# Patient Record
Sex: Male | Born: 1970 | Race: White | Hispanic: No | Smoking: Never smoker
Health system: Southern US, Community
[De-identification: ages and names within clinical notes are randomized; demographics above are authoritative.]

## PROBLEM LIST (undated history)

## (undated) DIAGNOSIS — E785 Hyperlipidemia, unspecified: Secondary | ICD-10-CM

## (undated) DIAGNOSIS — F329 Major depressive disorder, single episode, unspecified: Secondary | ICD-10-CM

## (undated) DIAGNOSIS — F32A Depression, unspecified: Secondary | ICD-10-CM

## (undated) DIAGNOSIS — K589 Irritable bowel syndrome without diarrhea: Secondary | ICD-10-CM

## (undated) DIAGNOSIS — K219 Gastro-esophageal reflux disease without esophagitis: Secondary | ICD-10-CM

## (undated) DIAGNOSIS — G629 Polyneuropathy, unspecified: Secondary | ICD-10-CM

## (undated) HISTORY — PX: KNEE ARTHROSCOPY: SUR90

## (undated) HISTORY — PX: EYE SURGERY: SHX253

## (undated) HISTORY — PX: GASTRIC BYPASS: SHX52

---

## 2005-02-04 HISTORY — PX: LIPOSUCTION TRUNK: SUR833

## 2015-05-24 ENCOUNTER — Encounter (INDEPENDENT_AMBULATORY_CARE_PROVIDER_SITE_OTHER): Payer: Self-pay | Admitting: Family Medicine

## 2015-05-24 DIAGNOSIS — Z0289 Encounter for other administrative examinations: Secondary | ICD-10-CM

## 2015-09-11 ENCOUNTER — Encounter: Payer: Self-pay | Admitting: *Deleted

## 2015-09-12 ENCOUNTER — Encounter
Admission: RE | Disposition: A | Discharge status: Home / Self Care | Payer: Self-pay | Source: Ambulatory Visit | Attending: Gastroenterology

## 2015-09-12 ENCOUNTER — Ambulatory Visit
Admission: RE | Admit: 2015-09-12 | Discharge: 2015-09-12 | Disposition: A | Discharge status: Home / Self Care | Payer: BC Managed Care – PPO | Source: Ambulatory Visit | Attending: Gastroenterology | Admitting: Gastroenterology

## 2015-09-12 ENCOUNTER — Ambulatory Visit: Payer: BC Managed Care – PPO | Admitting: Anesthesiology

## 2015-09-12 ENCOUNTER — Encounter: Payer: Self-pay | Admitting: *Deleted

## 2015-09-12 DIAGNOSIS — E785 Hyperlipidemia, unspecified: Secondary | ICD-10-CM | POA: Insufficient documentation

## 2015-09-12 DIAGNOSIS — D122 Benign neoplasm of ascending colon: Secondary | ICD-10-CM | POA: Insufficient documentation

## 2015-09-12 DIAGNOSIS — K589 Irritable bowel syndrome without diarrhea: Secondary | ICD-10-CM | POA: Diagnosis not present

## 2015-09-12 DIAGNOSIS — Z7982 Long term (current) use of aspirin: Secondary | ICD-10-CM | POA: Diagnosis not present

## 2015-09-12 DIAGNOSIS — Z79899 Other long term (current) drug therapy: Secondary | ICD-10-CM | POA: Diagnosis not present

## 2015-09-12 DIAGNOSIS — K219 Gastro-esophageal reflux disease without esophagitis: Secondary | ICD-10-CM | POA: Diagnosis not present

## 2015-09-12 DIAGNOSIS — F329 Major depressive disorder, single episode, unspecified: Secondary | ICD-10-CM | POA: Diagnosis not present

## 2015-09-12 DIAGNOSIS — Z86010 Personal history of colonic polyps: Secondary | ICD-10-CM | POA: Insufficient documentation

## 2015-09-12 DIAGNOSIS — K5730 Diverticulosis of large intestine without perforation or abscess without bleeding: Secondary | ICD-10-CM | POA: Insufficient documentation

## 2015-09-12 DIAGNOSIS — D125 Benign neoplasm of sigmoid colon: Secondary | ICD-10-CM | POA: Insufficient documentation

## 2015-09-12 DIAGNOSIS — K640 First degree hemorrhoids: Secondary | ICD-10-CM | POA: Insufficient documentation

## 2015-09-12 HISTORY — DX: Irritable bowel syndrome, unspecified: K58.9

## 2015-09-12 HISTORY — DX: Depression, unspecified: F32.A

## 2015-09-12 HISTORY — DX: Polyneuropathy, unspecified: G62.9

## 2015-09-12 HISTORY — PX: COLONOSCOPY WITH PROPOFOL: SHX5780

## 2015-09-12 HISTORY — DX: Hyperlipidemia, unspecified: E78.5

## 2015-09-12 HISTORY — DX: Gastro-esophageal reflux disease without esophagitis: K21.9

## 2015-09-12 HISTORY — DX: Major depressive disorder, single episode, unspecified: F32.9

## 2015-09-12 SURGERY — COLONOSCOPY WITH PROPOFOL
Anesthesia: General

## 2015-09-12 MED ORDER — PHENYLEPHRINE HCL 10 MG/ML IJ SOLN
INTRAMUSCULAR | Status: DC | PRN
  Administered 2015-09-12 (×3): 100 ug via INTRAVENOUS

## 2015-09-12 MED ORDER — EPHEDRINE SULFATE 50 MG/ML IJ SOLN
INTRAMUSCULAR | Status: DC | PRN
  Administered 2015-09-12: 10 mg via INTRAVENOUS
  Administered 2015-09-12: 5 mg via INTRAVENOUS

## 2015-09-12 MED ORDER — FENTANYL CITRATE (PF) 100 MCG/2ML IJ SOLN
INTRAMUSCULAR | Status: DC | PRN
  Administered 2015-09-12: 50 ug via INTRAVENOUS

## 2015-09-12 MED ORDER — SODIUM CHLORIDE 0.9 % IV SOLN: INTRAVENOUS | Status: DC

## 2015-09-12 MED ORDER — MIDAZOLAM HCL 2 MG/2ML IJ SOLN
INTRAMUSCULAR | Status: DC | PRN
  Administered 2015-09-12: 1 mg via INTRAVENOUS

## 2015-09-12 MED ORDER — LIDOCAINE HCL (CARDIAC) 20 MG/ML IV SOLN
INTRAVENOUS | Status: DC | PRN
  Administered 2015-09-12: 60 mg via INTRAVENOUS

## 2015-09-12 MED ORDER — PROPOFOL 10 MG/ML IV BOLUS
INTRAVENOUS | Status: DC | PRN
  Administered 2015-09-12: 50 mg via INTRAVENOUS

## 2015-09-12 MED ORDER — SODIUM CHLORIDE 0.9 % IV SOLN
INTRAVENOUS | Status: DC
  Administered 2015-09-12: 15:00:00 via INTRAVENOUS

## 2015-09-12 MED ORDER — PROPOFOL 500 MG/50ML IV EMUL
INTRAVENOUS | Status: DC | PRN
  Administered 2015-09-12: 140 ug/kg/min via INTRAVENOUS

## 2015-09-12 NOTE — Transfer of Care (Signed)
Immediate Anesthesia Transfer of Care Note  Patient: Andrew Short  Procedure(s) Performed: Procedure(s): COLONOSCOPY WITH PROPOFOL (N/A)  Patient Location: Endoscopy Unit  Anesthesia Type:General  Level of Consciousness: awake, alert , oriented and patient cooperative  Airway & Oxygen Therapy: Patient Spontanous Breathing and Patient connected to nasal cannula oxygen  Post-op Assessment: Report given to RN, Post -op Vital signs reviewed and stable and Patient moving all extremities X 4  Post vital signs: Reviewed and stable  Last Vitals:  Vitals:   09/12/15 1429  BP: 134/84  Pulse: 62  Resp: 17  Temp: 36.7 C    Last Pain:  Vitals:   09/12/15 1429  TempSrc: Tympanic         Complications: No apparent anesthesia complications

## 2015-09-12 NOTE — Anesthesia Postprocedure Evaluation (Signed)
Anesthesia Post Note  Patient: Andrew Short  Procedure(s) Performed: Procedure(s) (LRB): COLONOSCOPY WITH PROPOFOL (N/A)  Patient location during evaluation: Endoscopy Anesthesia Type: General Level of consciousness: awake and alert Pain management: pain level controlled Vital Signs Assessment: post-procedure vital signs reviewed and stable Respiratory status: spontaneous breathing, nonlabored ventilation, respiratory function stable and patient connected to nasal cannula oxygen Cardiovascular status: blood pressure returned to baseline and stable Postop Assessment: no signs of nausea or vomiting Anesthetic complications: no    Last Vitals:  Vitals:   09/12/15 1429 09/12/15 1542  BP: 134/84   Pulse: 62   Resp: 17   Temp: 36.7 C 36.3 C    Last Pain:  Vitals:   09/12/15 1542  TempSrc: Tympanic                 Martha Clan

## 2015-09-12 NOTE — Anesthesia Preprocedure Evaluation (Signed)
Anesthesia Evaluation  Patient identified by MRN, date of birth, ID band Patient awake    Reviewed: Allergy & Precautions, NPO status , Patient's Chart, lab work & pertinent test results  Airway Mallampati: II       Dental no notable dental hx.  Dental staple in back:   Pulmonary neg pulmonary ROS,    Pulmonary exam normal        Cardiovascular negative cardio ROS Normal cardiovascular exam     Neuro/Psych PSYCHIATRIC DISORDERS Depression    GI/Hepatic Neg liver ROS, GERD  Medicated,IBS   Endo/Other  negative endocrine ROS  Renal/GU negative Renal ROS  negative genitourinary   Musculoskeletal negative musculoskeletal ROS (+)   Abdominal Normal abdominal exam  (+)   Peds negative pediatric ROS (+)  Hematology negative hematology ROS (+)   Anesthesia Other Findings   Reproductive/Obstetrics                             Anesthesia Physical Anesthesia Plan  ASA: II  Anesthesia Plan: General   Post-op Pain Management:    Induction: Intravenous  Airway Management Planned: Nasal Cannula  Additional Equipment:   Intra-op Plan:   Post-operative Plan:   Informed Consent: I have reviewed the patients History and Physical, chart, labs and discussed the procedure including the risks, benefits and alternatives for the proposed anesthesia with the patient or authorized representative who has indicated his/her understanding and acceptance.   Dental advisory given  Plan Discussed with: CRNA and Surgeon  Anesthesia Plan Comments:         Anesthesia Quick Evaluation

## 2015-09-12 NOTE — Op Note (Signed)
[facility] Gastroenterology Patient Name: Andrew Short Procedure Date: 09/12/2015 3:07 PM MRN: [id number] Account #: [id number] Date of Birth: 06-03-1970 Admit Type: Outpatient Age: 45 Room: [facility] ENDO ROOM 3 Gender: Male Note Status: Finalized Procedure:            Colonoscopy Indications:          Personal history of colonic polyps Providers:            Lollie Sails, MD Referring MD:         Leonie Douglas. Doy Hutching, MD (Referring MD) Medicines:            Monitored Anesthesia Care Complications:        No immediate complications. Procedure:            Pre-Anesthesia Assessment:                       - ASA Grade Assessment: II - A patient with mild                        systemic disease.                       After obtaining informed consent, the colonoscope was                        passed under direct vision. Throughout the procedure,                        the patient's blood pressure, pulse, and oxygen                        saturations were monitored continuously. The                        Colonoscope was introduced through the anus and                        advanced to the the cecum, identified by appendiceal                        orifice and ileocecal valve. The colonoscopy was                        performed with moderate difficulty due to poor bowel                        prep. Successful completion of the procedure was aided                        by lavage. The patient tolerated the procedure well.                        The quality of the bowel preparation was fair. Findings:      A 2 mm polyp was found in the proximal ascending colon. The polyp was       flat. The polyp was removed with a cold biopsy forceps. Resection and       retrieval were complete.      A 1 mm polyp was found in the distal sigmoid colon. The polyp was       sessile.  The polyp was removed with a cold biopsy forceps. Resection and       retrieval were complete.      The  retroflexed view of the distal rectum and anal verge was normal and       showed no anal or rectal abnormalities.      Non-bleeding internal hemorrhoids were found during anoscopy. The       hemorrhoids were small and Grade I (internal hemorrhoids that do not       prolapse).      A few small to medium-mouthed diverticula were found in the sigmoid       colon, descending colon, transverse colon and ascending colon.      The digital rectal exam was normal. Impression:           - Preparation of the colon was fair.                       - One 2 mm polyp in the proximal ascending colon,                        removed with a cold biopsy forceps. Resected and                        retrieved.                       - One 1 mm polyp in the distal sigmoid colon, removed                        with a cold biopsy forceps. Resected and retrieved.                       - The distal rectum and anal verge are normal on                        retroflexion view.                       - Non-bleeding internal hemorrhoids.                       - Diverticulosis in the sigmoid colon and in the                        descending colon. Recommendation:       - Discharge patient to home.                       - Telephone GI clinic for pathology results in 1 week. Procedure Code(s):    --- Professional ---                       [phone], Colonoscopy, flexible; with biopsy, single or                        multiple Diagnosis Code(s):    --- Professional ---                       K64.0, First degree hemorrhoids                       D12.2, Benign  neoplasm of ascending colon                       D12.5, Benign neoplasm of sigmoid colon                       Z86.010, Personal history of colonic polyps                       K57.30, Diverticulosis of large intestine without                        perforation or abscess without bleeding CPT copyright 2016 American Medical Association. All rights reserved. The codes  documented in this report are preliminary and upon coder review may  be revised to meet current compliance requirements. Lollie Sails, MD 09/12/2015 3:40:58 PM This report has been signed electronically. Number of Addenda: 0 Note Initiated On: 09/12/2015 3:07 PM Scope Withdrawal Time: 0 hours 8 minutes 41 seconds  Total Procedure Duration: 0 hours 22 minutes 40 seconds       [facility]

## 2015-09-12 NOTE — H&P (Signed)
Outpatient short stay form Pre-procedure 09/12/2015 2:57 PM Lollie Sails MD  Primary Physician: Dr. Fulton Reek  Reason for visit:  Colonoscopy  History of present illness:  Patient is a 45 year old male presenting today as above. He has personal history of  colon polyps in the past. He tolerated his prep well. He takes no aspirin with the exception of 81 mg aspirin daily this is been held for a couple days. or blood thinning agents.    Current Facility-Administered Medications:  .  0.9 %  sodium chloride infusion, , Intravenous, Continuous, Lollie Sails, MD, Last Rate: 20 mL/hr at 09/12/15 1443 .  0.9 %  sodium chloride infusion, , Intravenous, Continuous, Lollie Sails, MD  Prescriptions Prior to Admission  Medication Sig Dispense Refill Last Dose  . aspirin 81 MG tablet Take 81 mg by mouth daily.   Past Week at Unknown time  . buPROPion (WELLBUTRIN XL) 300 MG 24 hr tablet Take 300 mg by mouth daily.   09/11/2015 at Unknown time  . citalopram (CELEXA) 20 MG tablet Take 20 mg by mouth daily.   09/11/2015 at Unknown time  . magnesium oxide (MAG-OX) 400 MG tablet Take 400 mg by mouth daily.   Past Month at Unknown time  . Multiple Vitamin (MULTIVITAMIN) tablet Take 1 tablet by mouth daily.   Past Month at Unknown time  . rOPINIRole (REQUIP) 2 MG tablet Take 2 mg by mouth at bedtime.   Past Week at Unknown time  . traZODone (DESYREL) 50 MG tablet Take 50 mg by mouth at bedtime.   Past Week at Unknown time     No Known Allergies   Past Medical History:  Diagnosis Date  . Depression   . GERD (gastroesophageal reflux disease)   . Hyperlipemia   . IBS (irritable bowel syndrome)   . Neuropathy (HCC)    BIL.    Review of systems:      Physical Exam    Heart and lungs: Regular rate and rhythm without rub or gallop, lungs are bilaterally clear.    HEENT: Normocephalic atraumatic eyes are anicteric    Other:     Pertinant exam for procedure: Soft nontender  nondistended bowel sounds positive normoactive.    Planned proceedures: Colonoscopy and indicated procedures. I have discussed the risks benefits and complications of procedures to include not limited to bleeding, infection, perforation and the risk of sedation and the patient wishes to proceed.    Lollie Sails, MD Gastroenterology 09/12/2015  2:57 PM

## 2015-09-13 ENCOUNTER — Encounter: Payer: Self-pay | Admitting: Gastroenterology

## 2015-09-13 ENCOUNTER — Other Ambulatory Visit: Payer: Self-pay | Admitting: Cardiology

## 2015-09-13 DIAGNOSIS — R079 Chest pain, unspecified: Secondary | ICD-10-CM

## 2015-09-14 LAB — SURGICAL PATHOLOGY

## 2015-09-18 ENCOUNTER — Ambulatory Visit (HOSPITAL_COMMUNITY): Payer: BC Managed Care – PPO

## 2015-09-21 ENCOUNTER — Encounter (HOSPITAL_COMMUNITY): Payer: Self-pay

## 2015-09-21 ENCOUNTER — Ambulatory Visit (HOSPITAL_COMMUNITY)
Admission: RE | Admit: 2015-09-21 | Discharge: 2015-09-21 | Disposition: A | Discharge status: Home / Self Care | Payer: BC Managed Care – PPO | Source: Ambulatory Visit | Attending: Cardiology | Admitting: Cardiology

## 2015-09-21 DIAGNOSIS — I2510 Atherosclerotic heart disease of native coronary artery without angina pectoris: Secondary | ICD-10-CM | POA: Insufficient documentation

## 2015-09-21 DIAGNOSIS — R079 Chest pain, unspecified: Secondary | ICD-10-CM

## 2015-09-21 MED ORDER — METOPROLOL TARTRATE 5 MG/5ML IV SOLN
INTRAVENOUS | Status: AC
  Filled 2015-09-21: qty 15

## 2015-09-21 MED ORDER — NITROGLYCERIN 0.4 MG SL SUBL
SUBLINGUAL_TABLET | SUBLINGUAL | Status: AC
  Filled 2015-09-21: qty 2

## 2015-09-21 MED ORDER — NITROGLYCERIN 0.4 MG SL SUBL
0.8000 mg | SUBLINGUAL_TABLET | Freq: Once | SUBLINGUAL | Status: AC
  Administered 2015-09-21: 0.8 mg via SUBLINGUAL
  Filled 2015-09-21: qty 25

## 2015-09-21 MED ORDER — METOPROLOL TARTRATE 5 MG/5ML IV SOLN
5.0000 mg | INTRAVENOUS | Status: DC | PRN
  Administered 2015-09-21 (×2): 5 mg via INTRAVENOUS
  Filled 2015-09-21 (×3): qty 5

## 2015-09-21 MED ORDER — IOPAMIDOL (ISOVUE-370) INJECTION 76%
INTRAVENOUS | Status: AC
  Administered 2015-09-21: 80 mL
  Filled 2015-09-21: qty 100

## 2015-09-21 NOTE — Progress Notes (Signed)
CT scan completed. Tolerated well. D/C home alone walking. Awake and alert. In no distress.

## 2017-07-16 ENCOUNTER — Encounter: Payer: Self-pay | Admitting: Family Medicine

## 2017-07-16 ENCOUNTER — Ambulatory Visit: Payer: Self-pay | Admitting: Family Medicine

## 2017-07-16 DIAGNOSIS — F32A Depression, unspecified: Secondary | ICD-10-CM | POA: Insufficient documentation

## 2017-07-16 DIAGNOSIS — F329 Major depressive disorder, single episode, unspecified: Secondary | ICD-10-CM

## 2017-07-16 NOTE — Progress Notes (Signed)
BP 113/77 (BP Location: Left Arm, Patient Position: Sitting, Cuff Size: Normal)   Pulse 75   Ht 5' 11.5" (1.816 m)   Wt 252 lb 12.8 oz (114.7 kg)   BMI 34.77 kg/m    Subjective:    Patient ID: Andrew Short, male    DOB: Feb 10, 1970, 46 y.o.   MRN: [id number]  HPI: Andrew Short is a 47 y.o. 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 Reviewed FAA policy and antidepressants.  Relevant past medical, surgical, family and social history reviewed and updated as indicated. Interim medical history since our last visit reviewed. Allergies and medications reviewed and updated.  Review of Systems  Constitutional: Negative.   HENT: Negative.   Eyes: Negative.   Respiratory: Negative.   Cardiovascular: Negative.   Gastrointestinal: Negative.   Endocrine: Negative.   Genitourinary: Negative.   Musculoskeletal: Negative.   Skin: Negative.   Allergic/Immunologic: Negative.   Neurological: Negative.   Hematological: Negative.   Psychiatric/Behavioral: Negative.     Per HPI unless specifically indicated above     Objective:    BP 113/77 (BP Location: Left Arm, Patient Position: Sitting, Cuff Size: Normal)   Pulse 75   Ht 5' 11.5" (1.816 m)   Wt 252 lb 12.8 oz (114.7 kg)   BMI 34.77 kg/m   Wt Readings from Last 3 Encounters:  07/16/17 252 lb 12.8 oz (114.7 kg)  09/12/15 223 lb (101.2 kg)  05/24/15 209 lb (94.8 kg)    Physical Exam  Constitutional: He is oriented to person, place, and time. He appears well-developed and well-nourished.  HENT:  Head: Normocephalic.  Right Ear: External ear normal.  Left Ear: External ear normal.  Nose: Nose normal.  Eyes: Pupils are equal, round, and reactive to light. Conjunctivae and EOM are normal.  Neck: Normal range of motion. Neck supple. No thyromegaly present.  Cardiovascular: Normal rate, regular rhythm, normal heart sounds and intact distal pulses.    Pulmonary/Chest: Effort normal and breath sounds normal.  Abdominal: Soft. Bowel sounds are normal. There is no splenomegaly or hepatomegaly.  Genitourinary: Penis normal.  Musculoskeletal: Normal range of motion.  Lymphadenopathy:    He has no cervical adenopathy.  Neurological: He is alert and oriented to person, place, and time. He has normal reflexes.  Skin: Skin is warm and dry.  Psychiatric: He has a normal mood and affect. His behavior is normal. Judgment and thought content normal.    Results for orders placed or performed during the hospital encounter of 09/12/15  Surgical pathology  Result Value Ref Range   SURGICAL PATHOLOGY      Surgical Pathology CASE: [id number] PATIENT: Salem Southers Surgical Pathology Report     SPECIMEN SUBMITTED: A. Colon polyp, ascending; cbx B. Colon polyp, distal sigmoid; cbx  CLINICAL HISTORY: None provided  PRE-OPERATIVE DIAGNOSIS: Personal HX of colon polyps  POST-OPERATIVE DIAGNOSIS: Polyps, hemorrhoids, diverticulosis     DIAGNOSIS: A. COLON POLYP, ASCENDING; COLD BIOPSY: - TUBULAR ADENOMA, 2 FRAGMENTS. - NEGATIVE FOR HIGH-GRADE DYSPLASIA AND MALIGNANCY.  B. COLON POLYP, DISTAL SIGMOID; COLD BIOPSY: - HYPERPLASTIC POLYP. - NEGATIVE FOR DYSPLASIA AND MALIGNANCY.   GROSS DESCRIPTION: A. Labeled: ascending polyp C BX Tissue fragment(s): 2 Size: less than 0.1 and 0.15 cm Description: Tan  Entirely submitted in one cassette(s).   B. Labeled: distal sigmoid polyp C BX Tissue fragment(s): 1 Size: 0.2 cm Description: tan  Entirely submitted in 1 cassette(s).  Final  Diagnosis performed by Bryan Lemma, MD.  Electronically signed 09/14/2015  7:06:10PM    The electronic signature indicates that the named Attending Pathologist has evaluated the specimen  Technical component performed at [facility], [address] [place], [place] [address] Lab: [phone] Dir: Darrick Penna. Evette Doffing, MD  Professional component  performed at [facility], [facility], [place], [place],  [address] Lab: [phone] Dir: Dellia Nims. Reuel Derby, MD        Assessment & Plan:   Problem List Items Addressed This Visit      Other   Depression    Discussed depression and FAA.  Reviewed certification path on medicine and off medication.  Gave patient printouts from the [employer]. 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          Follow up plan: Return if symptoms worsen or fail to improve.

## 2017-07-16 NOTE — Assessment & Plan Note (Signed)
Discussed depression and FAA.  Reviewed certification path on medicine and off medication.  Gave patient printouts from the [employer]. 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

## 2017-08-29 ENCOUNTER — Other Ambulatory Visit: Payer: Self-pay | Admitting: Internal Medicine

## 2017-08-29 DIAGNOSIS — M5442 Lumbago with sciatica, left side: Secondary | ICD-10-CM

## 2017-09-15 ENCOUNTER — Ambulatory Visit (INDEPENDENT_AMBULATORY_CARE_PROVIDER_SITE_OTHER): Payer: Self-pay | Admitting: Family Medicine

## 2017-09-15 ENCOUNTER — Encounter: Payer: Self-pay | Admitting: Family Medicine

## 2017-09-15 VITALS — BP 127/82 | HR 84 | Ht 71.5 in | Wt 271.0 lb

## 2017-09-15 DIAGNOSIS — Z029 Encounter for administrative examinations, unspecified: Secondary | ICD-10-CM

## 2017-09-15 DIAGNOSIS — Z0289 Encounter for other administrative examinations: Secondary | ICD-10-CM

## 2017-09-15 NOTE — Progress Notes (Signed)
   BP 127/82   Pulse 84   Ht 5' 11.5" (1.816 m)   Wt 271 lb (122.9 kg)   BMI 37.27 kg/m    Subjective:    Patient ID: Paxten Appelt, male    DOB: 28-Apr-1970, 47 y.o.   MRN: [id number]  HPI: Nochum Fenter is a 47 y.o. male  Chief Complaint  Patient presents with  . FAA    Class 3    refluux meds prn otherwise neg No sleep apnea sx or signs Relevant past medical, surgical, family and social history reviewed and updated as indicated. Interim medical history since our last visit reviewed. Allergies and medications reviewed and updated.  Review of Systems  Constitutional: Negative.   HENT: Negative.   Eyes: Negative.   Respiratory: Negative.   Cardiovascular: Negative.   Gastrointestinal: Negative.   Endocrine: Negative.   Genitourinary: Negative.   Musculoskeletal: Negative.   Skin: Negative.   Allergic/Immunologic: Negative.   Neurological: Negative.   Hematological: Negative.   Psychiatric/Behavioral: Negative.     Per HPI unless specifically indicated above     Objective:    BP 127/82   Pulse 84   Ht 5' 11.5" (1.816 m)   Wt 271 lb (122.9 kg)   BMI 37.27 kg/m   Wt Readings from Last 3 Encounters:  09/15/17 271 lb (122.9 kg)  07/16/17 252 lb 12.8 oz (114.7 kg)  09/12/15 223 lb (101.2 kg)    Physical Exam  Constitutional: He is oriented to person, place, and time. He appears well-developed and well-nourished.  HENT:  Head: Normocephalic.  Right Ear: External ear normal.  Left Ear: External ear normal.  Nose: Nose normal.  Eyes: Pupils are equal, round, and reactive to light. Conjunctivae and EOM are normal.  Neck: Normal range of motion. Neck supple. No thyromegaly present.  Cardiovascular: Normal rate, regular rhythm, normal heart sounds and intact distal pulses.  Pulmonary/Chest: Effort normal and breath sounds normal.  Abdominal: Soft. Bowel sounds are normal. There is no splenomegaly or hepatomegaly.  Genitourinary: Penis normal.  Musculoskeletal:  Normal range of motion.  Lymphadenopathy:    He has no cervical adenopathy.  Neurological: He is alert and oriented to person, place, and time. He has normal reflexes.  Skin: Skin is warm and dry.  Psychiatric: He has a normal mood and affect. His behavior is normal. Judgment and thought content normal.        Assessment & Plan:   Problem List Items Addressed This Visit    None    Visit Diagnoses    Encounter for Systems analyst [facility]) examination    -  Primary       Follow up plan: Return if symptoms worsen or fail to improve.

## 2017-09-16 ENCOUNTER — Ambulatory Visit: Payer: BC Managed Care – PPO

## 2017-09-18 ENCOUNTER — Ambulatory Visit
Admission: RE | Admit: 2017-09-18 | Discharge: 2017-09-18 | Disposition: A | Discharge status: Home / Self Care | Payer: BC Managed Care – PPO | Source: Ambulatory Visit | Attending: Internal Medicine | Admitting: Internal Medicine

## 2017-09-18 DIAGNOSIS — M5117 Intervertebral disc disorders with radiculopathy, lumbosacral region: Secondary | ICD-10-CM | POA: Diagnosis not present

## 2017-09-18 DIAGNOSIS — M5442 Lumbago with sciatica, left side: Secondary | ICD-10-CM

## 2017-09-18 DIAGNOSIS — M48061 Spinal stenosis, lumbar region without neurogenic claudication: Secondary | ICD-10-CM | POA: Diagnosis not present

## 2018-12-29 ENCOUNTER — Other Ambulatory Visit: Payer: Self-pay | Admitting: *Deleted

## 2018-12-29 DIAGNOSIS — Z20822 Contact with and (suspected) exposure to covid-19: Secondary | ICD-10-CM

## 2018-12-30 LAB — NOVEL CORONAVIRUS, NAA: SARS-CoV-2, NAA: NOT DETECTED

## 2019-02-16 ENCOUNTER — Other Ambulatory Visit: Payer: Self-pay

## 2019-02-16 ENCOUNTER — Emergency Department
Admission: EM | Admit: 2019-02-16 | Discharge: 2019-02-16 | Disposition: A | Discharge status: Home / Self Care | Payer: BC Managed Care – PPO | Attending: Emergency Medicine | Admitting: Emergency Medicine

## 2019-02-16 ENCOUNTER — Emergency Department: Payer: BC Managed Care – PPO

## 2019-02-16 ENCOUNTER — Encounter: Payer: Self-pay | Admitting: Emergency Medicine

## 2019-02-16 DIAGNOSIS — R1110 Vomiting, unspecified: Secondary | ICD-10-CM | POA: Insufficient documentation

## 2019-02-16 DIAGNOSIS — R1010 Upper abdominal pain, unspecified: Secondary | ICD-10-CM | POA: Diagnosis present

## 2019-02-16 DIAGNOSIS — R109 Unspecified abdominal pain: Secondary | ICD-10-CM

## 2019-02-16 DIAGNOSIS — R1012 Left upper quadrant pain: Secondary | ICD-10-CM | POA: Insufficient documentation

## 2019-02-16 LAB — URINALYSIS, COMPLETE (UACMP) WITH MICROSCOPIC
Bacteria, UA: NONE SEEN
Bilirubin Urine: NEGATIVE
Glucose, UA: NEGATIVE mg/dL
Hgb urine dipstick: NEGATIVE
Ketones, ur: NEGATIVE mg/dL
Leukocytes,Ua: NEGATIVE
Nitrite: NEGATIVE
Protein, ur: NEGATIVE mg/dL
Specific Gravity, Urine: 1.021 (ref 1.005–1.030)
Squamous Epithelial / LPF: NONE SEEN (ref 0–5)
pH: 5 (ref 5.0–8.0)

## 2019-02-16 LAB — COMPREHENSIVE METABOLIC PANEL
ALT: 23 U/L (ref 0–44)
AST: 20 U/L (ref 15–41)
Albumin: 4.1 g/dL (ref 3.5–5.0)
Alkaline Phosphatase: 57 U/L (ref 38–126)
Anion gap: 7 (ref 5–15)
BUN: 22 mg/dL — ABNORMAL HIGH (ref 6–20)
CO2: 30 mmol/L (ref 22–32)
Calcium: 9.2 mg/dL (ref 8.9–10.3)
Chloride: 102 mmol/L (ref 98–111)
Creatinine, Ser: 1.05 mg/dL (ref 0.61–1.24)
GFR calc Af Amer: >60 mL/min (ref >60)
GFR calc non Af Amer: >60 mL/min (ref >60)
Glucose, Bld: 106 mg/dL — ABNORMAL HIGH (ref 70–99)
Potassium: 4.6 mmol/L (ref 3.5–5.1)
Sodium: 139 mmol/L (ref 135–145)
Total Bilirubin: 1 mg/dL (ref 0.3–1.2)
Total Protein: 7.7 g/dL (ref 6.5–8.1)

## 2019-02-16 LAB — CBC
HCT: 47.2 % (ref 39.0–52.0)
Hemoglobin: 16.4 g/dL (ref 13.0–17.0)
MCH: 30.5 pg (ref 26.0–34.0)
MCHC: 34.7 g/dL (ref 30.0–36.0)
MCV: 87.9 fL (ref 80.0–100.0)
Platelets: 201 10*3/uL (ref 150–400)
RBC: 5.37 MIL/uL (ref 4.22–5.81)
RDW: 12.4 % (ref 11.5–15.5)
WBC: 6.5 10*3/uL (ref 4.0–10.5)
nRBC: 0 % (ref 0.0–0.2)

## 2019-02-16 LAB — LIPASE, BLOOD: Lipase: 32 U/L (ref 11–51)

## 2019-02-16 MED ORDER — OXYCODONE-ACETAMINOPHEN 5-325 MG PO TABS
2.0000 | ORAL_TABLET | Freq: Once | ORAL | Status: AC
  Administered 2019-02-16: 12:00:00 2 via ORAL
  Filled 2019-02-16: qty 2

## 2019-02-16 NOTE — ED Triage Notes (Signed)
Pt reports sharp abd pains and back pain for the past week or two. Pt reports the pain moves around some. Pt reports the week before it was to his right side and this week his left side and last night it centered in his mid back. Pt reports NV last pm and reports the pain increases when he takes a deep breath.

## 2019-02-16 NOTE — ED Provider Notes (Signed)
[facility] Emergency Department Provider Note       Time seen: ----------------------------------------- 12:16 PM on 02/16/2019 -----------------------------------------   I have reviewed the triage vital signs and the nursing notes.  HISTORY   Chief Complaint Abdominal Pain, Emesis, and Nausea    HPI Andrew Short is a 49 y.o. male with a history of depression, GERD, hyperlipidemia, IBS, neuropathy who presents to the ED for abdominal pain and back pain for the past week or so.  Pain has been mostly on the right side but he is also noted some on the left.  He denies any injury or trauma.  He did vomit last night.  Past Medical History:  Diagnosis Date  . Depression   . GERD (gastroesophageal reflux disease)   . Hyperlipemia   . IBS (irritable bowel syndrome)   . Neuropathy    BIL.    There are no problems to display for this patient.   Past Surgical History:  Procedure Laterality Date  . COLONOSCOPY WITH PROPOFOL N/A 09/12/2015   Procedure: COLONOSCOPY WITH PROPOFOL;  Surgeon: Lollie Sails, MD;  Location: [facility] ENDOSCOPY;  Service: Endoscopy;  Laterality: N/A;  . EYE SURGERY     LASIK  . KNEE ARTHROSCOPY Left   . LIPOSUCTION TRUNK  2007    Allergies Patient has no known allergies.  Social History Social History   Tobacco Use  . Smoking status: Never Smoker  . Smokeless tobacco: Never Used  Substance Use Topics  . Alcohol use: Yes  . Drug use: No   Review of Systems Constitutional: Negative for fever. Cardiovascular: Negative for chest pain. Respiratory: Negative for shortness of breath. Gastrointestinal: Positive for abdominal pain, recent vomiting Musculoskeletal: Positive for back pain Skin: Negative for rash. Neurological: Negative for headaches, focal weakness or numbness.  All systems negative/normal/unremarkable except as stated in the HPI  ____________________________________________   PHYSICAL EXAM:  VITAL  SIGNS: ED Triage Vitals  Enc Vitals Group     BP 02/16/19 1052 (!) 134/97     Pulse Rate 02/16/19 1052 70     Resp 02/16/19 1052 18     Temp 02/16/19 1052 98.1 F (36.7 C)     Temp Source 02/16/19 1052 Oral     SpO2 02/16/19 1052 99 %     Weight 02/16/19 1050 271 lb (122.9 kg)     Height 02/16/19 1050 5\' 11"  (1.803 m)     Head Circumference --      Peak Flow --      Pain Score 02/16/19 1049 7     Pain Loc --      Pain Edu? --      Excl. in [place]? --    Constitutional: Alert and oriented. Well appearing and in no distress. Eyes: Conjunctivae are normal. Normal extraocular movements. ENT      Head: Normocephalic and atraumatic.      Nose: No congestion/rhinnorhea.      Mouth/Throat: Mucous membranes are moist.      Neck: No stridor. Cardiovascular: Normal rate, regular rhythm. No murmurs, rubs, or gallops. Respiratory: Normal respiratory effort without tachypnea nor retractions. Breath sounds are clear and equal bilaterally. No wheezes/rales/rhonchi. Gastrointestinal: Right upper quadrant tenderness, no rebound or guarding.  Normal bowel sounds. Musculoskeletal: Nontender with normal range of motion in extremities. No lower extremity tenderness nor edema. Neurologic:  Normal speech and language. No gross focal neurologic deficits are appreciated.  Skin:  Skin is warm, dry and intact. No rash noted. Psychiatric: Mood  and affect are normal. Speech and behavior are normal.  ____________________________________________  EKG: Interpreted by me.  Sinus rhythm with rate of 70 bpm, normal PR interval, normal QRS, normal QT  ____________________________________________  ED COURSE:  As part of my medical decision making, I reviewed the following data within the [place] History obtained from family if available, nursing notes, old chart and ekg, as well as notes from prior ED visits. Patient presented for abdominal pain, we will assess with labs and imaging as indicated  at this time.   Procedures  Audric Weidner was evaluated in Emergency Department on 02/16/2019 for the symptoms described in the history of present illness. He was evaluated in the context of the global COVID-19 pandemic, which necessitated consideration that the patient might be at risk for infection with the SARS-CoV-2 virus that causes COVID-19. Institutional protocols and algorithms that pertain to the evaluation of patients at risk for COVID-19 are in a state of rapid change based on information released by regulatory bodies including the CDC and federal and state organizations. These policies and algorithms were followed during the patient's care in the ED.  ____________________________________________   LABS (pertinent positives/negatives)  Labs Reviewed  COMPREHENSIVE METABOLIC PANEL - Abnormal; Notable for the following components:      Result Value   Glucose, Bld 106 (*)    BUN 22 (*)    All other components within normal limits  URINALYSIS, COMPLETE (UACMP) WITH MICROSCOPIC - Abnormal; Notable for the following components:   Color, Urine YELLOW (*)    APPearance CLEAR (*)    All other components within normal limits  LIPASE, BLOOD  CBC    RADIOLOGY Images were viewed by me  Right upper quadrant ultrasound IMPRESSION:  Negative CT abdomen pelvis. No acute abnormality. Negative for  urinary tract calculi.   Normal appendix.  IMPRESSION:  1. Increased liver echogenicity, a finding indicative of hepatic  steatosis. No focal liver lesions are evident.   2. Study otherwise unremarkable.  ____________________________________________   DIFFERENTIAL DIAGNOSIS   Biliary colic, cholecystitis, UTI, pyelonephritis, renal colic, muscle strain, IBS  FINAL ASSESSMENT AND PLAN  Flank pain  Plan: The patient had presented for right flank pain. Patient's labs were reassuring. Patient's imaging did not reveal any acute process, initially I thought this would be biliary colic but  his imaging was negative.  CT and ultrasound were reassuring.  He is cleared for outpatient follow-up.   Laurence Aly, MD    Note: This note was generated in part or whole with voice recognition software. Voice recognition is usually quite accurate but there are transcription errors that can and very often do occur. I apologize for any typographical errors that were not detected and corrected.     Earleen Newport, MD 02/16/19 1331

## 2019-02-17 ENCOUNTER — Telehealth: Payer: Self-pay | Admitting: Gastroenterology

## 2019-02-17 NOTE — Telephone Encounter (Signed)
Left vm to offer ed f/u apt with Dr. Marius Ditch

## 2019-02-25 ENCOUNTER — Telehealth: Payer: Self-pay | Admitting: Gastroenterology

## 2019-02-25 ENCOUNTER — Encounter: Payer: Self-pay | Admitting: Gastroenterology

## 2019-02-25 NOTE — Telephone Encounter (Signed)
Left vm to offer Ed f/u apt with Dr. Marius Ditch. Letter sent

## 2019-03-03 ENCOUNTER — Other Ambulatory Visit: Payer: Self-pay | Admitting: Internal Medicine

## 2019-03-03 ENCOUNTER — Ambulatory Visit
Admission: RE | Admit: 2019-03-03 | Discharge: 2019-03-03 | Disposition: A | Discharge status: Home / Self Care | Payer: BC Managed Care – PPO | Source: Ambulatory Visit | Attending: Internal Medicine | Admitting: Internal Medicine

## 2019-03-03 ENCOUNTER — Other Ambulatory Visit: Payer: Self-pay

## 2019-03-03 ENCOUNTER — Other Ambulatory Visit (HOSPITAL_COMMUNITY): Payer: Self-pay | Admitting: Internal Medicine

## 2019-03-03 DIAGNOSIS — R0781 Pleurodynia: Secondary | ICD-10-CM

## 2019-03-03 IMAGING — CT CT ANGIO CHEST
2 of 6 series · 19 of 46 positions shown · IV contrast (APPLIED)
Comparison: Cardiac CT angiography [DATE].

CLINICAL DATA: Epigastric pain radiating to the back for 3 weeks.

EXAM:
CT ANGIOGRAPHY CHEST WITH CONTRAST
TECHNIQUE: Multidetector CT imaging of the chest was performed using the
standard protocol during bolus administration of intravenous
contrast. Multiplanar CT image reconstructions and MIPs were
obtained to evaluate the vascular anatomy.
CONTRAST:  75mL OMNIPAQUE IOHEXOL 350 MG/ML SOLN

[Series 5: thins · axial · 0.71mm/px · z∈[-359,-86]mm · 16 of 301 slices shown]
[im 14/301  lung]
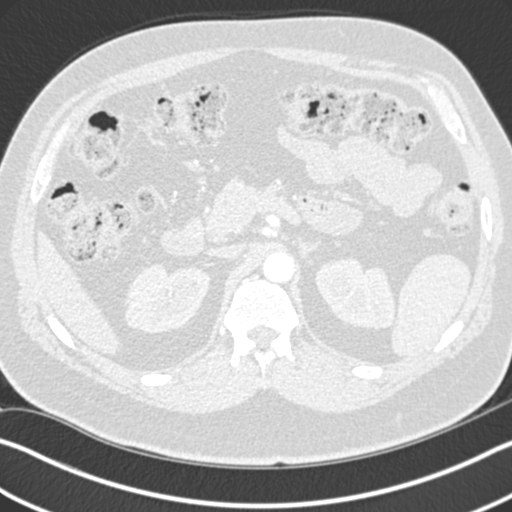
[im 40/301  soft-tissue]
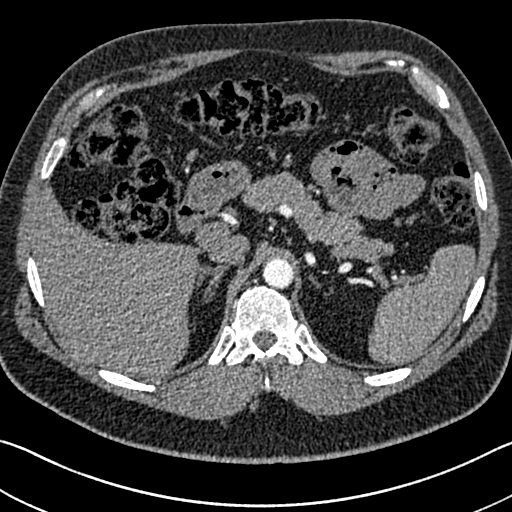
[im 53/301  lung]
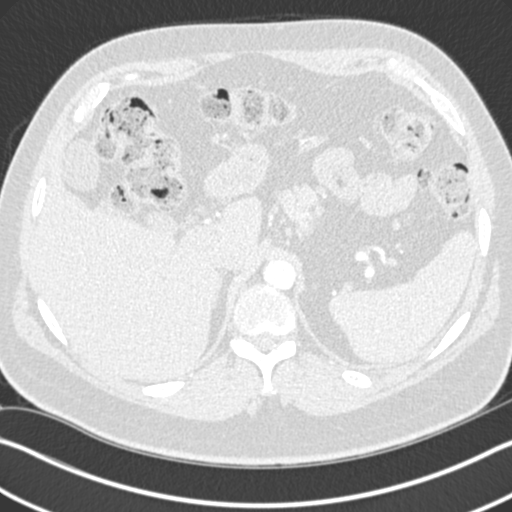
[im 66/301  soft-tissue]
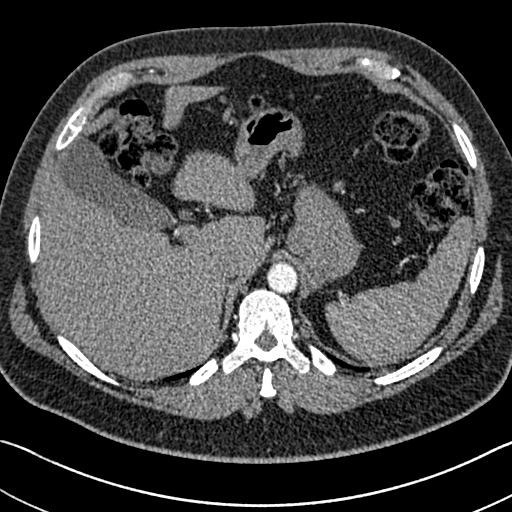
[im 92/301  lung]
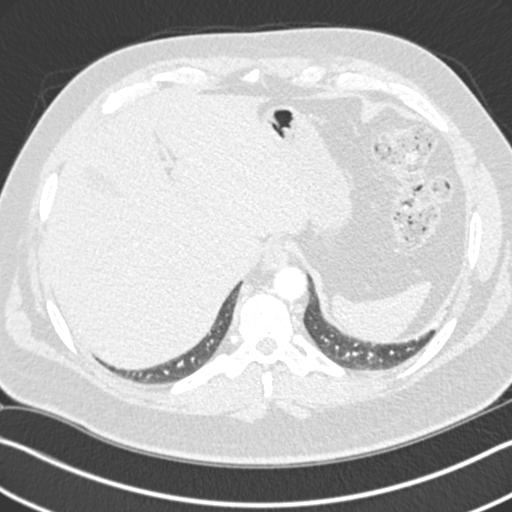
[im 105/301  soft-tissue]
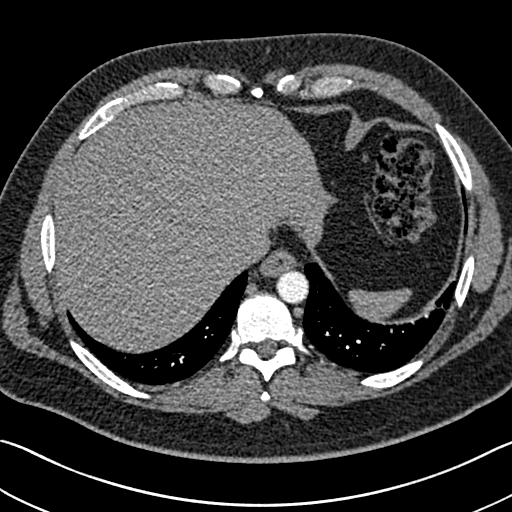
[im 118/301  lung]
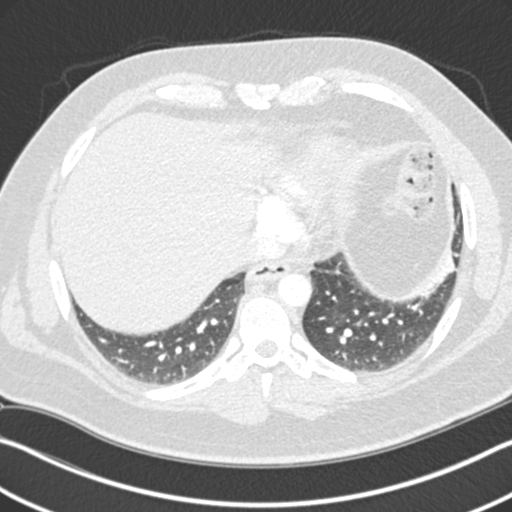
[im 144/301  soft-tissue]
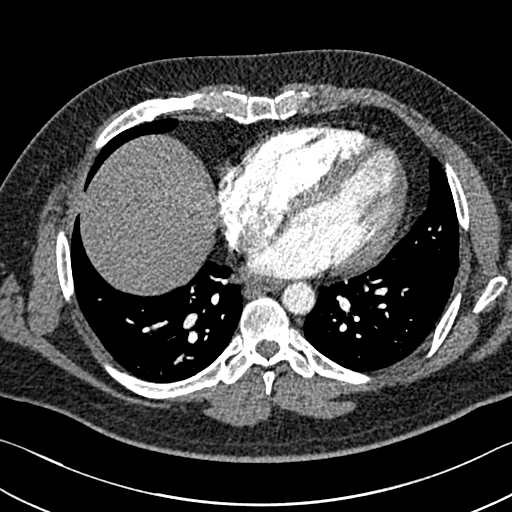
[im 157/301  lung]
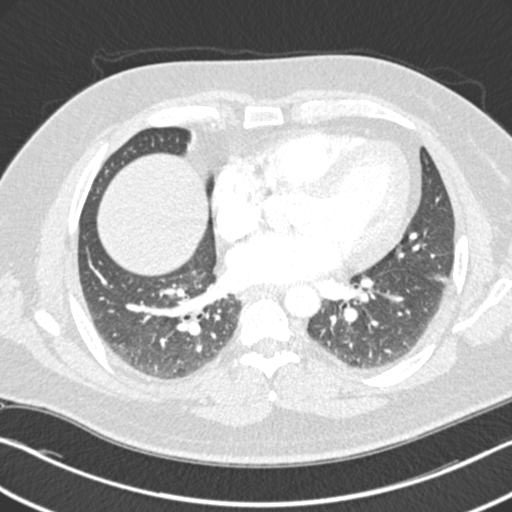
[im 183/301  soft-tissue]
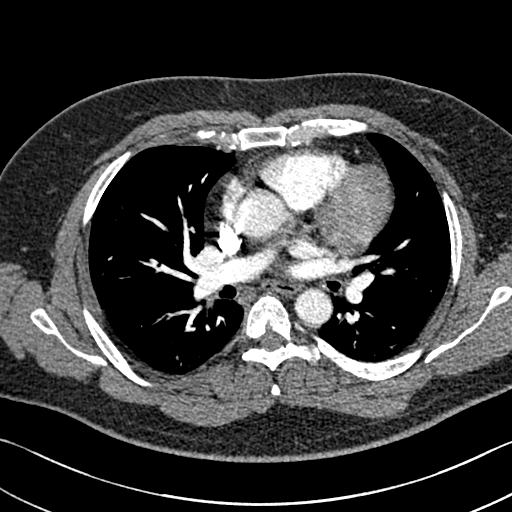
[im 196/301  lung]
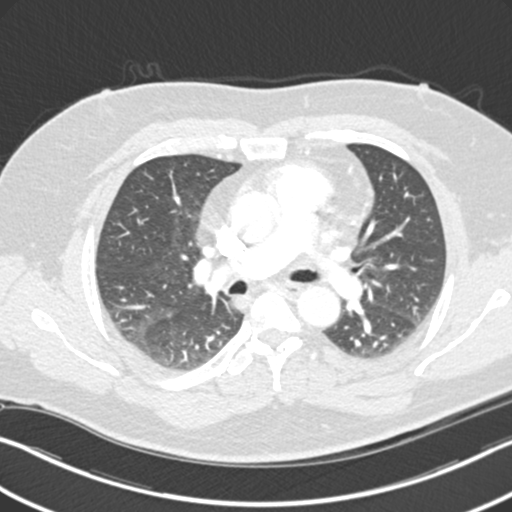
[im 209/301  soft-tissue]
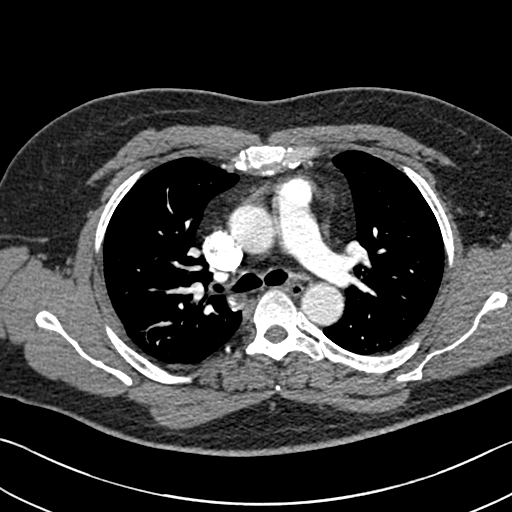
[im 235/301  lung]
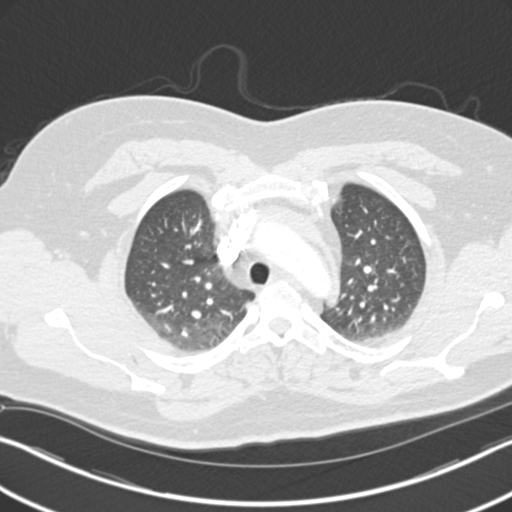
[im 248/301  soft-tissue]
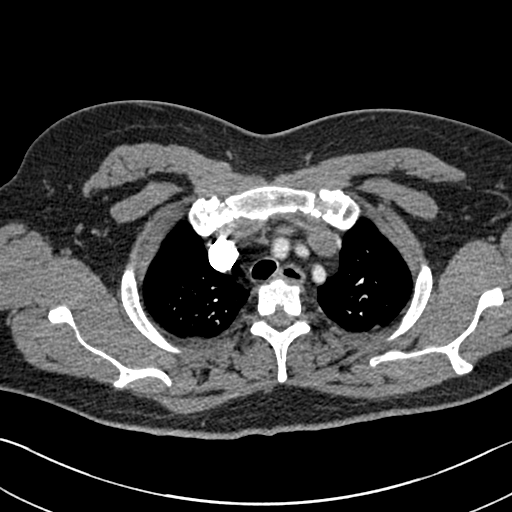
[im 261/301  lung]
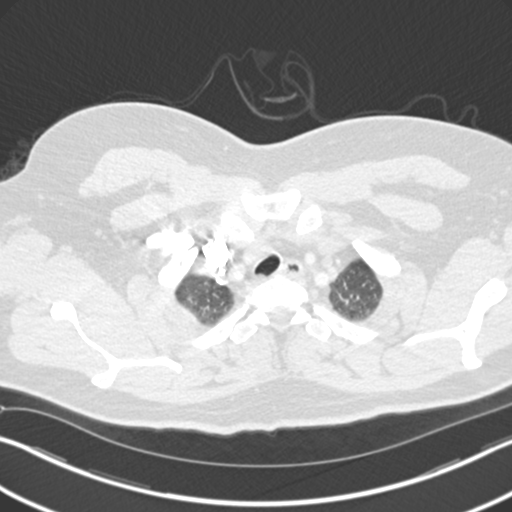
[im 287/301  soft-tissue]
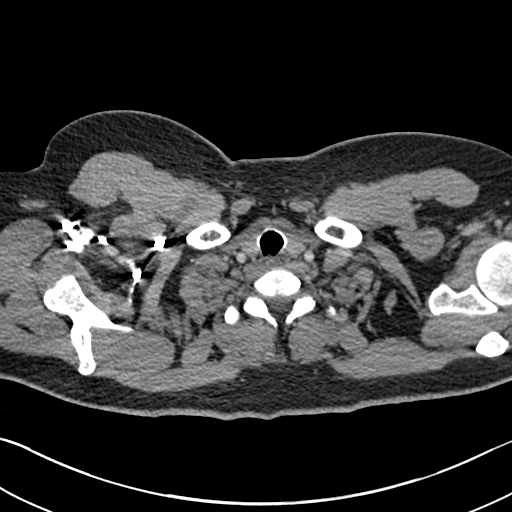

[Series 7: coronal mpr · coronal · 0.64mm/px · 3 of 96 slices shown]
[im 24/96  soft-tissue]
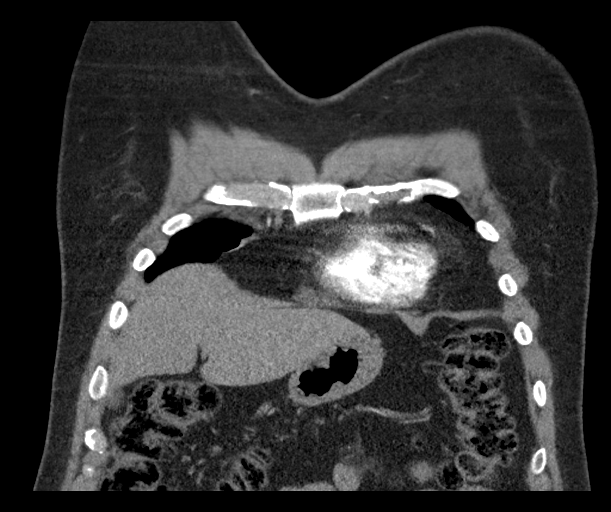
[im 48/96  soft-tissue]
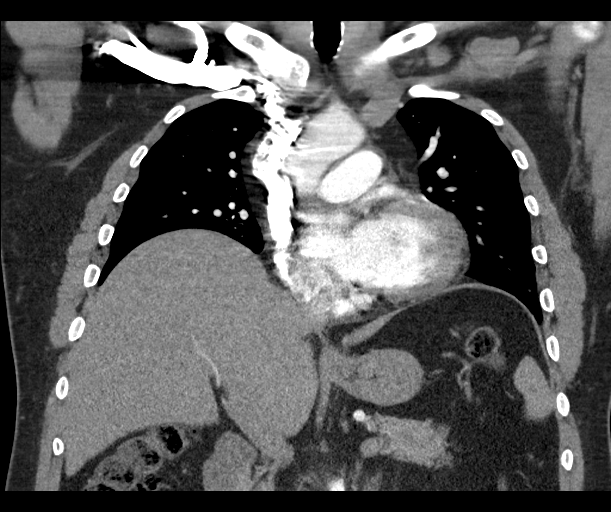
[im 72/96  soft-tissue]
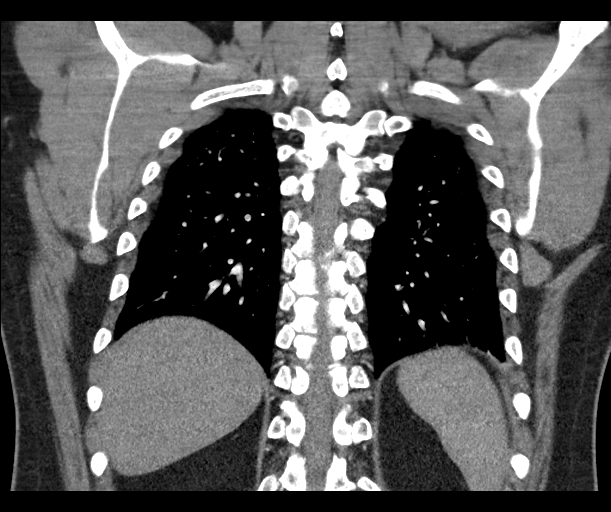

[19 of 46 positions shown; findings below may reference images not displayed]

FINDINGS: Cardiovascular: Negative for pulmonary embolus. Vascular structures
are unremarkable. Heart is mildly enlarged. No pericardial effusion.

Mediastinum/Nodes: No pathologically enlarged mediastinal, hilar or
axillary lymph nodes. Esophagus is unremarkable.

Lungs/Pleura: Lungs are clear. No pleural fluid. Airway is
unremarkable.

Upper Abdomen: Liver, gallbladder, adrenal glands and visualized
portions of the kidneys, spleen, pancreas, stomach and bowel are
grossly unremarkable. No upper abdominal adenopathy.

Musculoskeletal: None.

Review of the MIP images confirms the above findings.
IMPRESSION: Negative for pulmonary embolus. No findings to explain the patient's
given symptoms.

## 2019-03-03 MED ORDER — IOHEXOL 350 MG/ML SOLN
75.0000 mL | Freq: Once | INTRAVENOUS | Status: AC | PRN
  Administered 2019-03-03: 75 mL via INTRAVENOUS

## 2019-04-04 ENCOUNTER — Other Ambulatory Visit: Payer: Self-pay

## 2019-04-04 ENCOUNTER — Ambulatory Visit: Payer: BC Managed Care – PPO | Attending: Internal Medicine

## 2019-04-04 DIAGNOSIS — Z23 Encounter for immunization: Secondary | ICD-10-CM | POA: Insufficient documentation

## 2019-04-04 NOTE — Progress Notes (Signed)
   Covid-19 Vaccination Clinic  Name:  Andrew Short    MRN: [id number] DOB: 1970-03-31  04/04/2019  Andrew Short was observed post Covid-19 immunization for 15 minutes without incidence. He was provided with Vaccine Information Sheet and instruction to access the V-Safe system.   Andrew Short was instructed to call 911 with any severe reactions post vaccine: Marland Kitchen Difficulty breathing  . Swelling of your face and throat  . A fast heartbeat  . A bad rash all over your body  . Dizziness and weakness    Immunizations Administered    Name Date Dose VIS Date Route   Moderna COVID-19 Vaccine 04/04/2019 12:10 PM 0.5 mL 01/05/2019 Intramuscular   Manufacturer: Moderna   Lot: [id number]   [place]:[id number]

## 2019-04-06 ENCOUNTER — Ambulatory Visit: Payer: BC Managed Care – PPO

## 2019-05-08 ENCOUNTER — Ambulatory Visit: Payer: BC Managed Care – PPO | Attending: Internal Medicine

## 2019-05-08 DIAGNOSIS — Z23 Encounter for immunization: Secondary | ICD-10-CM

## 2019-05-08 NOTE — Progress Notes (Signed)
   Covid-19 Vaccination Clinic  Name:  Andrew Short    MRN: [id number] DOB: December 14, 1970  05/08/2019  [name] was observed post Covid-19 immunization for 15 minutes without incident. He was provided with Vaccine Information Sheet and instruction to access the V-Safe system.   [name] was instructed to call 911 with any severe reactions post vaccine: Marland Kitchen Difficulty breathing  . Swelling of face and throat  . A fast heartbeat  . A bad rash all over body  . Dizziness and weakness   Immunizations Administered    Name Date Dose VIS Date Route   Moderna COVID-19 Vaccine 05/08/2019 12:06 PM 0.5 mL 01/05/2019 Intramuscular   Manufacturer: Moderna   Lot: [id number]   [id number]

## 2020-11-08 ENCOUNTER — Other Ambulatory Visit: Payer: Self-pay

## 2020-11-08 ENCOUNTER — Ambulatory Visit: Payer: BC Managed Care – PPO | Admitting: Dermatology

## 2020-11-08 DIAGNOSIS — D239 Other benign neoplasm of skin, unspecified: Secondary | ICD-10-CM

## 2020-11-08 DIAGNOSIS — L821 Other seborrheic keratosis: Secondary | ICD-10-CM

## 2020-11-08 DIAGNOSIS — W57XXXA Bitten or stung by nonvenomous insect and other nonvenomous arthropods, initial encounter: Secondary | ICD-10-CM

## 2020-11-08 DIAGNOSIS — Z1283 Encounter for screening for malignant neoplasm of skin: Secondary | ICD-10-CM | POA: Diagnosis not present

## 2020-11-08 DIAGNOSIS — L739 Follicular disorder, unspecified: Secondary | ICD-10-CM | POA: Diagnosis not present

## 2020-11-08 DIAGNOSIS — L719 Rosacea, unspecified: Secondary | ICD-10-CM | POA: Diagnosis not present

## 2020-11-08 DIAGNOSIS — D235 Other benign neoplasm of skin of trunk: Secondary | ICD-10-CM

## 2020-11-08 DIAGNOSIS — D1801 Hemangioma of skin and subcutaneous tissue: Secondary | ICD-10-CM

## 2020-11-08 DIAGNOSIS — L814 Other melanin hyperpigmentation: Secondary | ICD-10-CM

## 2020-11-08 DIAGNOSIS — L819 Disorder of pigmentation, unspecified: Secondary | ICD-10-CM

## 2020-11-08 DIAGNOSIS — L578 Other skin changes due to chronic exposure to nonionizing radiation: Secondary | ICD-10-CM

## 2020-11-08 DIAGNOSIS — D229 Melanocytic nevi, unspecified: Secondary | ICD-10-CM

## 2020-11-08 MED ORDER — KETOCONAZOLE 2 % EX SHAM: 1.0000 "application " | MEDICATED_SHAMPOO | CUTANEOUS | 3 refills | Status: AC

## 2020-11-08 MED ORDER — MOMETASONE FUROATE 0.1 % EX CREA: 1.0000 "application " | TOPICAL_CREAM | CUTANEOUS | 1 refills | Status: AC

## 2020-11-08 NOTE — Patient Instructions (Addendum)
If you have any questions or concerns for your doctor, please call our main line at [phone] and press option 4 to reach your doctor's medical assistant. If no one answers, please leave a voicemail as directed and we will return your call as soon as possible. Messages left after 4 pm will be answered the following business day.   You may also send us a message via MyChart. We typically respond to MyChart messages within 1-2 business days.  For prescription refills, please ask your pharmacy to contact our office. Our fax number is [phone].  If you have an urgent issue when the clinic is closed that cannot wait until the next business day, you can page your doctor at the number below.    Please note that while we do our best to be available for urgent issues outside of office hours, we are not available 24/7.   If you have an urgent issue and are unable to reach us, you may choose to seek medical care at your doctor's office, retail clinic, urgent care center, or emergency room.  If you have a medical emergency, please immediately call 911 or go to the emergency department.  Pager Numbers  - Dr. Kowalski: [phone]  - Dr. Moye: [phone]  - Dr. Stewart: [phone]  In the event of inclement weather, please call our main line at [phone] for an update on the status of any delays or closures.  Dermatology Medication Tips: Please keep the boxes that topical medications come in in order to help keep track of the instructions about where and how to use these. Pharmacies typically print the medication instructions only on the boxes and not directly on the medication tubes.   If your medication is too expensive, please contact our office at [phone] option 4 or send us a message through MyChart.   We are unable to tell what your co-pay for medications will be in advance as this is different depending on your insurance coverage. However, we may be able to find a substitute  medication at lower cost or fill out paperwork to get insurance to cover a needed medication.   If a prior authorization is required to get your medication covered by your insurance company, please allow us 1-2 business days to complete this process.  Drug prices often vary depending on where the prescription is filled and some pharmacies may offer cheaper prices.  The website www.goodrx.com contains coupons for medications through different pharmacies. The prices here do not account for what the cost may be with help from insurance (it may be cheaper with your insurance), but the website can give you the price if you did not use any insurance.  - You can print the associated coupon and take it with your prescription to the pharmacy.  - You may also stop by our office during regular business hours and pick up a GoodRx coupon card.  - If you need your prescription sent electronically to a different pharmacy, notify our office through [facility] MyChart or by phone at [phone] option 4.  Instructions for [facility] Medications  One or more of your medications was sent to the [facility] mail order compounding pharmacy. You will receive an email from them and can purchase the medicine through that link. It will then be mailed to your home at the address you confirmed. If for any reason you do not receive an email from them, please check your spam folder. If you still do not find the email,   please let us know. [facility] phone number is [phone].   

## 2020-11-08 NOTE — Progress Notes (Signed)
New Patient Visit  Subjective  Andrew Short is a 50 y.o. male who presents for the following: bumps (Scalp, yrs on and off, tried prednisone in past, neosporin), red spot (Abdomen, yrs, has gotten larger), and bite reactions (R lower leg, yrs). The patient presents for Upper Body Skin Exam (UBSE) for skin cancer screening and mole check.  New patient referral from Dr. Fulton Reek.  The following portions of the chart were reviewed this encounter and updated as appropriate:   Tobacco  Allergies  Meds  Problems  Med Hx  Surg Hx  Fam Hx     Review of Systems:  No other skin or systemic complaints except as noted in HPI or Assessment and Plan.  Objective  Well appearing patient in no apparent distress; mood and affect are within normal limits.  All skin waist up examined.  Scalp Crust and excoriations scalp  Left Upper Back Firm pink/brown papulenodule with dimple sign.   bil lower legs Hyperpigmented macules and linear scar bil lower legs  bil lower legs Hyperpigmented macules lower legs   Assessment & Plan   Lentigines - Scattered tan macules - Due to sun exposure - Benign-appearing, observe - Recommend daily broad spectrum sunscreen SPF 30+ to sun-exposed areas, reapply every 2 hours as needed. - Call for any changes  Seborrheic Keratoses - Stuck-on, waxy, tan-brown papules and/or plaques  - Benign-appearing - Discussed benign etiology and prognosis. - Observe - Call for any changes  Melanocytic Nevi - Tan-brown and/or pink-flesh-colored symmetric macules and papules - Benign appearing on exam today - Observation - Call clinic for new or changing moles - Recommend daily use of broad spectrum spf 30+ sunscreen to sun-exposed areas.   Hemangiomas - Red papules - Discussed benign nature - Observe - Call for any changes  Actinic Damage - Chronic condition, secondary to cumulative UV/sun exposure - diffuse scaly erythematous macules with  underlying dyspigmentation - Recommend daily broad spectrum sunscreen SPF 30+ to sun-exposed areas, reapply every 2 hours as needed.  - Staying in the shade or wearing long sleeves, sun glasses (UVA+UVB protection) and wide brim hats (4-inch brim around the entire circumference of the hat) are also recommended for sun protection.  - Call for new or changing lesions.  Skin cancer screening performed today.  Folliculitis / Rosacea of the scalp Scalp Rosacea is a chronic progressive skin condition usually affecting the face of adults, causing redness and/or acne bumps. It is treatable but not curable. It sometimes affects the eyes (ocular rosacea) as well. It may respond to topical and/or systemic medication and can flare with stress, sun exposure, alcohol, exercise and some foods.  Daily application of broad spectrum spf 30+ sunscreen to face is recommended to reduce flares.  Will prescribe [facility] metronidazole/ivermectin/azelaic acid qhs to scalp. The patient was advised this is not covered by insurance since it is made by a compounding pharmacy. They will receive an email to check out and the medication will be mailed to their home.    Start Ketoconazole 2% shampoo to scalp 3x/wk let sit 5 minutes and rinse out  ketoconazole (NIZORAL) 2 % shampoo - Scalp Apply 1 application topically 3 (three) times a week. Wash scalp 3 times weekly, let sit 5 minutes and rinse out  Dermatofibroma Left Upper Back Benign, observe.    Post-inflammatory pigmentary changes bil lower legs Benign, observe.    Bug bite without infection, initial encounter bil lower legs Resolved Start Mometasone cr qd up to 5d/wk prn  bug bites  mometasone (ELOCON) 0.1 % cream - bil lower legs Apply 1 application topically as directed. Qd up to 5 days a week to bug bites on legs prn flares  Return in about 4 months (around 09/08/5013) for Folliculitis/rosacea f/u, .  I, Othelia Pulling, RMA, am acting as scribe for  Sarina Ser, MD . Documentation: I have reviewed the above documentation for accuracy and completeness, and I agree with the above.  Sarina Ser, MD

## 2020-11-14 ENCOUNTER — Encounter: Payer: Self-pay | Admitting: Dermatology

## 2021-03-19 ENCOUNTER — Encounter: Payer: Self-pay | Admitting: Dermatology

## 2021-03-19 ENCOUNTER — Ambulatory Visit: Payer: BC Managed Care – PPO | Admitting: Dermatology

## 2021-03-19 ENCOUNTER — Other Ambulatory Visit: Payer: Self-pay

## 2021-03-19 DIAGNOSIS — L739 Follicular disorder, unspecified: Secondary | ICD-10-CM

## 2021-03-19 NOTE — Progress Notes (Signed)
° °  Follow-Up Visit   Subjective  Andrew Short is a 51 y.o. male who presents for the following: Rosacea (Rosacea/folliculitis. Scalp. 4 month recheck. Using Ketoconazole shampoo 3x/week as directed and [facility] metronidazole/ivermectin/azelaic acid qhs to scalp. Patient reports overall improvement ).  The following portions of the chart were reviewed this encounter and updated as appropriate:  Tobacco   Allergies   Meds   Problems   Med Hx   Surg Hx   Fam Hx      Review of Systems: No other skin or systemic complaints except as noted in HPI or Assessment and Plan.  Objective  Well appearing patient in no apparent distress; mood and affect are within normal limits.  A focused examination was performed including head, including the scalp, face, neck, nose, ears, eyelids, and lips. Relevant physical exam findings are noted in the Assessment and Plan.  Scalp 1 active follicular based papule at vertex scalp   Assessment & Plan  Folliculitis / Rosacea of scalp Scalp Chronic and persistent condition with duration or expected duration over one year. Condition is symptomatic / bothersome to patient. Improving. Not to goal, but improving.  Continue:  [facility] metronidazole/ivermectin/azelaic acid qhs to scalp and  Ketoconazole shampoo as directed.  If not continuing to improve consider adding low dose oral antibiotics (Doxycycline 40mg ) or also discussed oral Isotretinoin.  Pt wants to continue current regimen.  Discussed triggers - stress; other medical conditions; foods.   Rosacea is a chronic progressive skin condition usually affecting the face of adults, causing redness and/or acne bumps. It is treatable but not curable. It sometimes affects the eyes (ocular rosacea) as well. It may respond to topical and/or systemic medication and can flare with stress, sun exposure, alcohol, exercise and some foods.  Daily application of broad spectrum spf 30+ sunscreen to face is  recommended to reduce flares.  Related Medications ketoconazole (NIZORAL) 2 % shampoo Apply 1 application topically 3 (three) times a week. Wash scalp 3 times weekly, let sit 5 minutes and rinse out  Return in 1 year (on 03/19/2022) for Rosace Follow Up.  I, Emelia Salisbury, CMA, am acting as scribe for Sarina Ser, MD. Documentation: I have reviewed the above documentation for accuracy and completeness, and I agree with the above.  Sarina Ser, MD

## 2021-03-19 NOTE — Patient Instructions (Signed)
Continue [facility] metronidazole/ivermectin/azelaic acid qhs to scalp and ketoconazole shampoo as directed.  If You Need Anything After Your Visit  If you have any questions or concerns for your doctor, please call our main line at [phone] and press option 4 to reach your doctor's medical assistant. If no one answers, please leave a voicemail as directed and we will return your call as soon as possible. Messages left after 4 pm will be answered the following business day.   You may also send [place] a message via [place]. We typically respond to MyChart messages within 1-2 business days.  For prescription refills, please ask your pharmacy to contact our office. Our fax number is [phone].  If you have an urgent issue when the clinic is closed that cannot wait until the next business day, you can page your doctor at the number below.    Please note that while we do our best to be available for urgent issues outside of office hours, we are not available 24/7.   If you have an urgent issue and are unable to reach [place], you may choose to seek medical care at your doctor's office, retail clinic, urgent care center, or emergency room.  If you have a medical emergency, please immediately call 911 or go to the emergency department.  Pager Numbers  - Dr. Nehemiah Massed: [phone]  - Dr. Laurence Ferrari: [phone]  - Dr. Nicole Kindred: [phone]  In the event of inclement weather, please call our main line at [phone] for an update on the status of any delays or closures.  Dermatology Medication Tips: Please keep the boxes that topical medications come in in order to help keep track of the instructions about where and how to use these. Pharmacies typically print the medication instructions only on the boxes and not directly on the medication tubes.   If your medication is too expensive, please contact our office at [phone] option 4 or send [place] a message through [place].   We are unable to  tell what your co-pay for medications will be in advance as this is different depending on your insurance coverage. However, we may be able to find a substitute medication at lower cost or fill out paperwork to get insurance to cover a needed medication.   If a prior authorization is required to get your medication covered by your insurance company, please allow [place] 1-2 business days to complete this process.  Drug prices often vary depending on where the prescription is filled and some pharmacies may offer cheaper prices.  The website www.goodrx.com contains coupons for medications through different pharmacies. The prices here do not account for what the cost may be with help from insurance (it may be cheaper with your insurance), but the website can give you the price if you did not use any insurance.  - You can print the associated coupon and take it with your prescription to the pharmacy.  - You may also stop by our office during regular business hours and pick up a GoodRx coupon card.  - If you need your prescription sent electronically to a different pharmacy, notify our office through [facility] or by phone at [phone] option 4.     Si Usted Necesita Algo Despus de Su Visita  Tambin puede enviarnos un mensaje a travs de Pharmacist, community. Por lo general respondemos a los mensajes de MyChart en el transcurso de 1 a 2 das hbiles.  Para renovar recetas, por favor pida a su farmacia que se ponga en  contacto con nuestra oficina. Harland Dingwall de fax es [place] [phone].  Si tiene un asunto urgente cuando la clnica est cerrada y que no puede esperar hasta el siguiente da hbil, puede llamar/localizar a su doctor(a) al nmero que aparece a continuacin.   Por favor, tenga en cuenta que aunque hacemos todo lo posible para estar disponibles para asuntos urgentes fuera del horario de [place], no estamos disponibles las 24 horas del da, los 7 das de la [place].   Si tiene un problema  urgente y no puede comunicarse con nosotros, puede optar por buscar atencin mdica  en el consultorio de su doctor(a), en una clnica privada, en un centro de atencin urgente o en una sala de emergencias.  Si tiene Engineering geologist, por favor llame inmediatamente al 911 o vaya a la sala de emergencias.  Nmeros de bper  - Dr. Nehemiah Massed: [phone]  - Dra. Moye: [phone]  - Dra. Nicole Kindred: [phone]  En caso de inclemencias del [place], por favor llame a Johnsie Kindred principal al [phone] para una actualizacin sobre el [place] de cualquier retraso o cierre.  Consejos para la medicacin en dermatologa: Por favor, guarde las cajas en las que vienen los medicamentos de uso tpico para ayudarle a seguir las instrucciones sobre dnde y cmo usarlos. Las farmacias generalmente imprimen las instrucciones del medicamento slo en las cajas y no directamente en los tubos del [place].   Si su medicamento es muy caro, por favor, pngase en contacto con Zigmund Daniel llamando al [phone] y presione la opcin 4 o envenos un mensaje a travs de Pharmacist, community.   No podemos decirle cul ser su copago por los medicamentos por adelantado ya que esto es diferente dependiendo de la cobertura de su seguro. Sin embargo, es posible que podamos encontrar un medicamento sustituto a Electrical engineer un formulario para que el seguro cubra el medicamento que se considera necesario.   Si se requiere una autorizacin previa para que su compaa de seguros [place] su medicamento, por favor permtanos de 1 a 2 das hbiles para completar este proceso.  Los precios de los medicamentos varan con frecuencia dependiendo del Environmental consultant de dnde se surte la receta y alguna farmacias pueden ofrecer precios ms baratos.  El sitio web www.goodrx.com tiene cupones para medicamentos de Airline pilot. Los precios aqu no tienen en cuenta lo que podra costar con la ayuda del seguro (puede ser ms barato con  su seguro), pero el sitio web puede darle el precio si no utiliz Research scientist (physical sciences).  - Puede imprimir el cupn correspondiente y llevarlo con su receta a la farmacia.  - Tambin puede pasar por nuestra oficina durante el horario de atencin regular y Charity fundraiser una tarjeta de cupones de GoodRx.  - Si necesita que su receta se enve electrnicamente a una farmacia diferente, informe a nuestra oficina a travs de MyChart de [place] o por telfono llamando al [phone] y presione la opcin 4.

## 2021-03-23 ENCOUNTER — Encounter: Payer: Self-pay | Admitting: Dermatology

## 2021-09-04 ENCOUNTER — Emergency Department: Payer: BC Managed Care – PPO

## 2021-09-04 ENCOUNTER — Other Ambulatory Visit: Payer: Self-pay

## 2021-09-04 ENCOUNTER — Emergency Department
Admission: EM | Admit: 2021-09-04 | Discharge: 2021-09-04 | Disposition: A | Discharge status: Home / Self Care | Payer: BC Managed Care – PPO | Attending: Emergency Medicine | Admitting: Emergency Medicine

## 2021-09-04 ENCOUNTER — Encounter: Payer: Self-pay | Admitting: Emergency Medicine

## 2021-09-04 DIAGNOSIS — Y92410 Unspecified street and highway as the place of occurrence of the external cause: Secondary | ICD-10-CM | POA: Diagnosis not present

## 2021-09-04 DIAGNOSIS — K838 Other specified diseases of biliary tract: Secondary | ICD-10-CM | POA: Diagnosis not present

## 2021-09-04 DIAGNOSIS — R1011 Right upper quadrant pain: Secondary | ICD-10-CM | POA: Diagnosis present

## 2021-09-04 DIAGNOSIS — R109 Unspecified abdominal pain: Secondary | ICD-10-CM

## 2021-09-04 LAB — CBC
HCT: 46.5 % (ref 39.0–52.0)
Hemoglobin: 15.6 g/dL (ref 13.0–17.0)
MCH: 29.4 pg (ref 26.0–34.0)
MCHC: 33.5 g/dL (ref 30.0–36.0)
MCV: 87.7 fL (ref 80.0–100.0)
Platelets: 172 10*3/uL (ref 150–400)
RBC: 5.3 MIL/uL (ref 4.22–5.81)
RDW: 13.2 % (ref 11.5–15.5)
WBC: 4.9 10*3/uL (ref 4.0–10.5)
nRBC: 0 % (ref 0.0–0.2)

## 2021-09-04 LAB — COMPREHENSIVE METABOLIC PANEL
ALT: 30 U/L (ref 0–44)
AST: 23 U/L (ref 15–41)
Albumin: 4.4 g/dL (ref 3.5–5.0)
Alkaline Phosphatase: 91 U/L (ref 38–126)
Anion gap: 7 (ref 5–15)
BUN: 16 mg/dL (ref 6–20)
CO2: 27 mmol/L (ref 22–32)
Calcium: 9.2 mg/dL (ref 8.9–10.3)
Chloride: 108 mmol/L (ref 98–111)
Creatinine, Ser: 0.85 mg/dL (ref 0.61–1.24)
GFR, Estimated: >60 mL/min (ref >60)
Glucose, Bld: 108 mg/dL — ABNORMAL HIGH (ref 70–99)
Potassium: 3.9 mmol/L (ref 3.5–5.1)
Sodium: 142 mmol/L (ref 135–145)
Total Bilirubin: 1.2 mg/dL (ref 0.3–1.2)
Total Protein: 7.4 g/dL (ref 6.5–8.1)

## 2021-09-04 LAB — LIPASE, BLOOD: Lipase: 47 U/L (ref 11–51)

## 2021-09-04 MED ORDER — ONDANSETRON HCL 4 MG/2ML IJ SOLN
4.0000 mg | Freq: Once | INTRAMUSCULAR | Status: AC
Start: 2021-09-04 — End: 2021-09-04
  Administered 2021-09-04: 4 mg via INTRAVENOUS
  Filled 2021-09-04: qty 2

## 2021-09-04 MED ORDER — ONDANSETRON 4 MG PO TBDP: 4.0000 mg | ORAL_TABLET | Freq: Three times a day (TID) | ORAL | 0 refills | Status: DC | PRN

## 2021-09-04 MED ORDER — SODIUM CHLORIDE 0.9 % IV BOLUS
1000.0000 mL | Freq: Once | INTRAVENOUS | Status: AC
  Administered 2021-09-04: 1000 mL via INTRAVENOUS

## 2021-09-04 MED ORDER — MORPHINE SULFATE (PF) 4 MG/ML IV SOLN
4.0000 mg | Freq: Once | INTRAVENOUS | Status: AC
  Administered 2021-09-04: 4 mg via INTRAVENOUS
  Filled 2021-09-04: qty 1

## 2021-09-04 MED ORDER — IOHEXOL 300 MG/ML  SOLN
100.0000 mL | Freq: Once | INTRAMUSCULAR | Status: AC | PRN
  Administered 2021-09-04: 100 mL via INTRAVENOUS

## 2021-09-04 MED ORDER — OXYCODONE-ACETAMINOPHEN 5-325 MG PO TABS: 1.0000 | ORAL_TABLET | ORAL | 0 refills | Status: DC | PRN

## 2021-09-04 NOTE — ED Notes (Signed)
Pt gives verbal consent to DC 

## 2021-09-04 NOTE — Discharge Instructions (Signed)
Please call the number provided for general surgery to arrange a follow-up appointment within the next couple weeks.  Return to the emergency department for any worsening pain, any yellowing of your eyes any fever or any other symptom personally concerning to yourself.  Please take your pain and nausea medication as needed but only as written.

## 2021-09-04 NOTE — ED Triage Notes (Signed)
[facility] sent from [facility]- states he was in [facility] Sunday evening. Yesterday morning when waking having abdominal pain worse with inspiration. States pain progressed throughout the day. Pain to right upper quadrant.
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
## 2021-09-11 ENCOUNTER — Ambulatory Visit: Payer: BC Managed Care – PPO | Admitting: Podiatry

## 2021-09-14 ENCOUNTER — Ambulatory Visit: Payer: BC Managed Care – PPO | Admitting: Podiatry

## 2021-11-13 ENCOUNTER — Emergency Department (HOSPITAL_COMMUNITY): Payer: BC Managed Care – PPO

## 2021-11-13 ENCOUNTER — Emergency Department (HOSPITAL_COMMUNITY)
Admission: EM | Admit: 2021-11-13 | Discharge: 2021-11-13 | Disposition: A | Discharge status: Home / Self Care | Payer: BC Managed Care – PPO | Attending: Emergency Medicine | Admitting: Emergency Medicine

## 2021-11-13 ENCOUNTER — Encounter (HOSPITAL_COMMUNITY): Payer: Self-pay | Admitting: *Deleted

## 2021-11-13 ENCOUNTER — Other Ambulatory Visit: Payer: Self-pay

## 2021-11-13 DIAGNOSIS — Z7982 Long term (current) use of aspirin: Secondary | ICD-10-CM | POA: Insufficient documentation

## 2021-11-13 DIAGNOSIS — R109 Unspecified abdominal pain: Secondary | ICD-10-CM | POA: Diagnosis present

## 2021-11-13 DIAGNOSIS — Z79899 Other long term (current) drug therapy: Secondary | ICD-10-CM | POA: Diagnosis not present

## 2021-11-13 DIAGNOSIS — R1011 Right upper quadrant pain: Secondary | ICD-10-CM | POA: Diagnosis not present

## 2021-11-13 DIAGNOSIS — R1010 Upper abdominal pain, unspecified: Secondary | ICD-10-CM

## 2021-11-13 LAB — URINALYSIS, ROUTINE W REFLEX MICROSCOPIC
Bilirubin Urine: NEGATIVE
Glucose, UA: NEGATIVE mg/dL
Hgb urine dipstick: NEGATIVE
Ketones, ur: 5 mg/dL — AB
Leukocytes,Ua: NEGATIVE
Nitrite: NEGATIVE
Protein, ur: NEGATIVE mg/dL
Specific Gravity, Urine: 1.021 (ref 1.005–1.030)
pH: 5 (ref 5.0–8.0)

## 2021-11-13 LAB — COMPREHENSIVE METABOLIC PANEL
ALT: 34 U/L (ref 0–44)
AST: 26 U/L (ref 15–41)
Albumin: 4.3 g/dL (ref 3.5–5.0)
Alkaline Phosphatase: 112 U/L (ref 38–126)
Anion gap: 7 (ref 5–15)
BUN: 11 mg/dL (ref 6–20)
CO2: 27 mmol/L (ref 22–32)
Calcium: 8.9 mg/dL (ref 8.9–10.3)
Chloride: 106 mmol/L (ref 98–111)
Creatinine, Ser: 0.71 mg/dL (ref 0.61–1.24)
GFR, Estimated: >60 mL/min (ref >60)
Glucose, Bld: 101 mg/dL — ABNORMAL HIGH (ref 70–99)
Potassium: 3.9 mmol/L (ref 3.5–5.1)
Sodium: 140 mmol/L (ref 135–145)
Total Bilirubin: 0.8 mg/dL (ref 0.3–1.2)
Total Protein: 7 g/dL (ref 6.5–8.1)

## 2021-11-13 LAB — CBC
HCT: 43.4 % (ref 39.0–52.0)
Hemoglobin: 14.5 g/dL (ref 13.0–17.0)
MCH: 29.1 pg (ref 26.0–34.0)
MCHC: 33.4 g/dL (ref 30.0–36.0)
MCV: 87.1 fL (ref 80.0–100.0)
Platelets: 173 10*3/uL (ref 150–400)
RBC: 4.98 MIL/uL (ref 4.22–5.81)
RDW: 12.8 % (ref 11.5–15.5)
WBC: 3.1 10*3/uL — ABNORMAL LOW (ref 4.0–10.5)
nRBC: 0 % (ref 0.0–0.2)

## 2021-11-13 LAB — LIPASE, BLOOD: Lipase: 38 U/L (ref 11–51)

## 2021-11-13 MED ORDER — ONDANSETRON HCL 4 MG/2ML IJ SOLN
4.0000 mg | Freq: Once | INTRAMUSCULAR | Status: AC
  Administered 2021-11-13: 4 mg via INTRAVENOUS
  Filled 2021-11-13: qty 2

## 2021-11-13 MED ORDER — OXYCODONE-ACETAMINOPHEN 5-325 MG PO TABS: 1.0000 | ORAL_TABLET | Freq: Four times a day (QID) | ORAL | 0 refills | Status: DC | PRN

## 2021-11-13 MED ORDER — KETOROLAC TROMETHAMINE 15 MG/ML IJ SOLN
15.0000 mg | Freq: Once | INTRAMUSCULAR | Status: AC
  Administered 2021-11-13: 15 mg via INTRAVENOUS
  Filled 2021-11-13: qty 1

## 2021-11-13 MED ORDER — HYDROMORPHONE HCL 1 MG/ML IJ SOLN
0.5000 mg | Freq: Once | INTRAMUSCULAR | Status: AC
  Administered 2021-11-13: 0.5 mg via INTRAVENOUS
  Filled 2021-11-13: qty 0.5

## 2021-11-13 MED ORDER — IOHEXOL 300 MG/ML  SOLN
100.0000 mL | Freq: Once | INTRAMUSCULAR | Status: AC | PRN
  Administered 2021-11-13: 100 mL via INTRAVENOUS

## 2021-11-13 NOTE — Discharge Instructions (Addendum)
Please read and follow all provided instructions.  Your diagnoses today include:  1. Upper abdominal pain     Tests performed today include: Blood cell counts and platelets Kidney and liver function tests Pancreas function test (called lipase) Urine test to look for infection CT scan of your abdomen and pelvis: Was reassuring without signs of infection, obstruction or other postsurgical problems Vital signs. See below for your results today.   Medications prescribed:  Percocet (oxycodone/acetaminophen) - narcotic pain medication  DO NOT drive or perform any activities that require you to be awake and alert because this medicine can make you drowsy. BE VERY CAREFUL not to take multiple medicines containing Tylenol (also called acetaminophen). Doing so can lead to an overdose which can damage your liver and cause liver failure and possibly death.  Take any prescribed medications only as directed.  Home care instructions:  Follow any educational materials contained in this packet.  Follow-up instructions: Please follow-up with your primary care provider or surgery provider in the next 2 days for further evaluation of your symptoms.    Return instructions:  SEEK IMMEDIATE MEDICAL ATTENTION IF: The pain does not go away or becomes severe  A temperature above 101F develops  Repeated vomiting occurs (multiple episodes)  The pain becomes localized to portions of the abdomen. The right side could possibly be appendicitis. In an adult, the left lower portion of the abdomen could be colitis or diverticulitis.  Blood is being passed in stools or vomit (bright red or black tarry stools)  You develop chest pain, difficulty breathing, dizziness or fainting, or become confused, poorly responsive, or inconsolable (young children) If you have any other emergent concerns regarding your health  Additional Information: Abdominal (belly) pain can be caused by many things. Your caregiver performed an  examination and possibly ordered blood/urine tests and imaging (CT scan, x-rays, ultrasound). Many cases can be observed and treated at home after initial evaluation in the emergency department. Even though you are being discharged home, abdominal pain can be unpredictable. Therefore, you need a repeated exam if your pain does not resolve, returns, or worsens. Most patients with abdominal pain don't have to be admitted to the hospital or have surgery, but serious problems like appendicitis and gallbladder attacks can start out as nonspecific pain. Many abdominal conditions cannot be diagnosed in one visit, so follow-up evaluations are very important.  Your vital signs today were: BP 129/83   Pulse (!) 53   Temp 97.7 F (36.5 C) (Oral)   Resp 18   Ht '5\' 11"'$  (1.803 m)   Wt 98.9 kg   SpO2 95%   BMI 30.40 kg/m  If your blood pressure (bp) was elevated above 135/85 this visit, please have this repeated by your doctor within one month. --------------

## 2021-11-13 NOTE — ED Provider Notes (Signed)
[facility] EMERGENCY DEPARTMENT Provider Note   CSN: [id number] Arrival date & time: 11/13/21  0920     History  Chief Complaint  Patient presents with   Abdominal Pain    Andrew Short is a 51 y.o. male.  Patient seen by myself on arrival in triage.  Here today for complaint of right upper abdominal pain.  Patient had bariatric surgery in June 2023 and cholecystectomy complicated by bile duct leak in August 2023.  States that he had a drain in place for about a month following this.  He has had pain and soreness in the right upper abdomen since that time.  He had shockwave therapy for pain relief done about a week ago which seems to make the intensity of the pain worse.  Since that time he has had intermittent sharp pains which are short-lived, that it can occur with various activities.  Today he bent over to put dog food into a dish and the pain came on abruptly and has been persistent.  This morning, pain was worse and stabbing prompting emergency department visit.  Patient has had baseline diarrhea.  No vomiting or fevers.  Surgery was in [place].  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      Home Medications Prior to Admission medications   Medication Sig Start Date End Date Taking? Authorizing Provider  allopurinol (ZYLOPRIM) 100 MG tablet Take 100 mg by mouth daily. 09/08/20   [provider]  ascorbic acid (VITAMIN C) 1000 MG tablet Take by mouth.    [provider]  aspirin 81 MG EC tablet Take by mouth. 07/11/17   [provider]  aspirin 81 MG tablet Take 81 mg by mouth daily.    [provider]  Cholecalciferol 25 MCG (1000 UT) tablet Take by mouth.    [provider]  ketoconazole (NIZORAL) 2 % shampoo Apply 1 application topically 3 (three) times a week. Wash scalp 3 times weekly, let sit 5 minutes and rinse out 11/08/20   Ralene Bathe, MD  mometasone (ELOCON) 0.1 % cream Apply 1 application topically as directed. Qd up to 5 days a  week to bug bites on legs prn flares 11/08/20   Ralene Bathe, MD  Multiple Vitamin (MULTIVITAMIN) tablet Take 1 tablet by mouth daily.    [provider]  ondansetron (ZOFRAN-ODT) 4 MG disintegrating tablet Take 1 tablet (4 mg total) by mouth every 8 (eight) hours as needed for nausea or vomiting. 09/04/21   Harvest Dark, MD  oxyCODONE-acetaminophen (PERCOCET) 5-325 MG tablet Take 1 tablet by mouth every 4 (four) hours as needed for severe pain. 09/04/21   Harvest Dark, MD  pravastatin (PRAVACHOL) 40 MG tablet Take by mouth. 05/29/20 05/29/21  [provider]      Allergies    Patient has no known allergies.    Review of Systems   Review of Systems  Physical Exam Updated Vital Signs BP 127/78   Pulse 60   Temp (!) 97.5 F (36.4 C) (Oral)   Resp 16   Ht '5\' 11"'$  (1.803 m)   Wt 98.9 kg   SpO2 95%   BMI 30.40 kg/m   Physical Exam Vitals and nursing note reviewed.  Constitutional:      General: He is not in acute distress.    Appearance: He is well-developed.  HENT:     Head: Normocephalic and atraumatic.  Eyes:     General:        Right eye:  No discharge.        Left eye: No discharge.     Conjunctiva/sclera: Conjunctivae normal.  Cardiovascular:     Rate and Rhythm: Normal rate and regular rhythm.     Heart sounds: Normal heart sounds.  Pulmonary:     Effort: Pulmonary effort is normal.     Breath sounds: Normal breath sounds.  Abdominal:     Palpations: Abdomen is soft.     Tenderness: There is abdominal tenderness (Mild) in the right upper quadrant and epigastric area. There is no guarding or rebound. Negative signs include Murphy's sign and McBurney's sign.  Musculoskeletal:     Cervical back: Normal range of motion and neck supple.  Skin:    General: Skin is warm and dry.  Neurological:     Mental Status: He is alert.    ED Results / Procedures / Treatments   Labs (all labs ordered are listed, but only abnormal results are  displayed) Labs Reviewed  COMPREHENSIVE METABOLIC PANEL - Abnormal; Notable for the following components:      Result Value   Glucose, Bld 101 (*)    All other components within normal limits  CBC - Abnormal; Notable for the following components:   WBC 3.1 (*)    All other components within normal limits  URINALYSIS, ROUTINE W REFLEX MICROSCOPIC - Abnormal; Notable for the following components:   Ketones, ur 5 (*)    All other components within normal limits  LIPASE, BLOOD    ED ECG REPORT   Date: 11/13/2021  Rate: 68  Rhythm: normal sinus rhythm  QRS Axis: normal  Intervals: normal  ST/T Wave abnormalities: normal  Conduction Disutrbances:none  Narrative Interpretation:   Old EKG Reviewed: unchanged  I have personally reviewed the EKG tracing and agree with the computerized printout as noted.   Radiology CT ABDOMEN PELVIS W CONTRAST  Result Date: 11/13/2021 CLINICAL DATA:  Abdominal pain, acute. Recent bariatric surgery. Cholecystectomy. EXAM: CT ABDOMEN AND PELVIS WITH CONTRAST TECHNIQUE: Multidetector CT imaging of the abdomen and pelvis was performed using the standard protocol following bolus administration of intravenous contrast. RADIATION DOSE REDUCTION: This exam was performed according to the departmental dose-optimization program which includes automated exposure control, adjustment of the mA and/or kV according to patient size and/or use of iterative reconstruction technique. CONTRAST:  148m OMNIPAQUE IOHEXOL 300 MG/ML  SOLN COMPARISON:  CT examination dated September 04, 2021 FINDINGS: Lower chest: No acute abnormality. Hepatobiliary: No focal liver abnormality is seen. Status post cholecystectomy. No biliary dilatation. Pancreas: Unremarkable. No pancreatic ductal dilatation or surrounding inflammatory changes. Spleen: Normal in size without focal abnormality. Adrenals/Urinary Tract: Adrenal glands are unremarkable. Kidneys are normal, without renal calculi, focal lesion,  or hydronephrosis. Bladder is unremarkable. Stomach/Bowel: Status post gastric bypass surgery. Bowel loops are normal in caliber. Appendix appears normal. Scattered colonic diverticula without evidence of acute diverticulitis. No evidence of bowel wall thickening, distention, or inflammatory changes. Vascular/Lymphatic: No significant vascular findings are present. No enlarged abdominal or pelvic lymph nodes. Reproductive: Prostate is unremarkable. Other: Mild fat stranding in the right anterior abdominal wall, likely postsurgical. No fluid collection or hematoma. No abdominopelvic ascites. Musculoskeletal: No acute or significant osseous findings. IMPRESSION: 1. No evidence of acute intra-abdominal or pelvic process. 2. Status post gastric bypass surgery. No evidence of bowel obstruction. 3. Scattered colonic diverticula without evidence of acute diverticulitis. 4. Mild fat stranding in the right anterior abdominal wall, likely postsurgical. No fluid collection or hematoma. Electronically Signed   By:  Imran  Ahmed D.O.   On: 11/13/2021 12:55    Procedures Procedures    Medications Ordered in ED Medications  HYDROmorphone (DILAUDID) injection 0.5 mg (has no administration in time range)  ondansetron (ZOFRAN) injection 4 mg (has no administration in time range)    ED Course/ Medical Decision Making/ A&P    Patient seen and examined. History obtained directly from patient. Work-up including labs, imaging, EKG ordered in triage, if performed, were reviewed.    Labs/EKG: Independently reviewed and interpreted.  This included: CBC with mild low white blood cell count at 3.1 otherwise unremarkable with normal hemoglobin; CMP unremarkable; lipase 38; UA unremarkable.  Imaging: Discussed imaging with patient versus treatment of symptoms and outpatient follow-up.  He would prefer to proceed with this.  CT abdomen and pelvis ordered.  Medications/Fluids: Ordered: Dilaudid IV 0.5 mg, Zofran IV 4 mg.     Most recent vital signs reviewed and are as follows: BP 127/78   Pulse 60   Temp (!) 97.5 F (36.4 C) (Oral)   Resp 16   Ht '5\' 11"'$  (1.803 m)   Wt 98.9 kg   SpO2 95%   BMI 30.40 kg/m   Initial impression: Right upper quadrant abdominal pain    Reassessment performed. Patient appears more uncomfortable after CT scan.  Describes pain as a band around his lower chest and abdominal area.  Will obtain EKG, although symptoms atypical for ACS.  Imaging personally visualized and interpreted including: CT abdomen pelvis, agree anterior abdominal stranding, but no other acute findings.  Reviewed pertinent lab work and imaging with patient at bedside. Questions answered.   Most current vital signs reviewed and are as follows: BP 129/83   Pulse (!) 53   Temp 97.7 F (36.5 C) (Oral)   Resp 18   Ht '5\' 11"'$  (1.803 m)   Wt 98.9 kg   SpO2 95%   BMI 30.40 kg/m   Plan: Continue pain control, will give additional 0.'5mg'$  IV dilaudid as well as '15mg'$  IV toradol.     3:17 PM Reassessment performed. Patient appears more comfortable now.  He is comfortable with d/c to home. Discussed possible etiologies of symptoms given reassuring work-up today.   Personally reviewed and interpreted EKG which is normal.  Reviewed pertinent lab work and imaging with patient at bedside. Questions answered.   Plan: Discharge to home.   Prescriptions written for: Percocet #8 tabs  Other home care instructions discussed:   ED return instructions discussed: The patient was urged to return to the Emergency Department immediately with worsening of current symptoms, worsening abdominal pain, persistent vomiting, blood noted in stools, fever, or any other concerns. The patient verbalized understanding.   Follow-up instructions discussed: Patient encouraged to follow-up with their PCP in 3 days.                            Medical Decision Making Amount and/or Complexity of Data Reviewed Labs:  ordered. Radiology: ordered.  Risk Prescription drug management.   For this patient's complaint of abdominal pain, the following conditions were considered on the differential diagnosis: gastritis/PUD, enteritis/duodenitis, appendicitis, cholelithiasis/cholecystitis, cholangitis, pancreatitis, ruptured viscus, colitis, diverticulitis, small/large bowel obstruction, proctitis, cystitis, pyelonephritis, ureteral colic, aortic dissection, aortic aneurysm. Atypical chest etiologies were also considered including ACS, PE, and pneumonia.   No complications from recent surgery identified.  The patient's vital signs, pertinent lab work and imaging were reviewed and interpreted as discussed in the ED course. Hospitalization  was considered for further testing, treatments, or serial exams/observation. However as patient is well-appearing, has a stable exam, and reassuring studies today, I do not feel that they warrant admission at this time. This plan was discussed with the patient who verbalizes agreement and comfort with this plan and seems reliable and able to return to the Emergency Department with worsening or changing symptoms.          Final Clinical Impression(s) / ED Diagnoses Final diagnoses:  Upper abdominal pain    Rx / DC Orders ED Discharge Orders          Ordered    oxyCODONE-acetaminophen (PERCOCET/ROXICET) 5-325 MG tablet  Every 6 hours PRN        11/13/21 1505              Carlisle Cater, PA-C 11/13/21 1520    Audley Hose, MD 11/13/21 2041

## 2021-11-13 NOTE — ED Triage Notes (Signed)
Pt c/o right lower abdominal pain; pt was seen at his doctor office and given shock wave therapy last week and he states the pain has gotten worse   Pt denies any n/v and states he has diarrhea all the time since his surgeries this past summer  Pt states he has been having more frequent bowel movements x one week

## 2021-11-13 NOTE — ED Notes (Signed)
Patient transported to CT 

## 2021-11-13 NOTE — ED Provider Triage Note (Signed)
Emergency Medicine Provider Triage Evaluation Note  Andrew Short , a 51 y.o. male  was evaluated in triage.  Pt complains of right upper abdominal pain.  Patient had bariatric surgery in June 2023 and cholecystectomy complicated by bile duct leak in August 2023.  He has had pain and soreness in the right upper abdomen since that time.  He had shockwave therapy for pain relief done about a week ago which seems to make the intensity of the pain worse.  This morning, pain was worse and stabbing prompting emergency department visit.  Patient has had baseline diarrhea.  No vomiting or fevers.    Review of Systems  Positive: Abdominal pain Negative: Fever  Physical Exam  BP 113/75   Pulse 62   Temp (!) 97.5 F (36.4 C) (Oral)   Resp 18   Ht '5\' 11"'$  (1.803 m)   Wt 98.9 kg   SpO2 99%   BMI 30.40 kg/m  Gen:   Awake, no distress   Resp:  Normal effort  MSK:   Moves extremities without difficulty  Other:  Tenderness right upper quadrant abdomen, without rebound or guarding  Medical Decision Making  Medically screening exam initiated at 10:17 AM.  Appropriate orders placed.  Andrew Short was informed that the remainder of the evaluation will be completed by another provider, this initial triage assessment does not replace that evaluation, and the importance of remaining in the ED until their evaluation is complete.     Carlisle Cater, PA-C 11/13/21 1019

## 2022-03-25 ENCOUNTER — Ambulatory Visit: Payer: BC Managed Care – PPO | Admitting: Dermatology

## 2022-03-25 VITALS — BP 122/79

## 2022-03-25 DIAGNOSIS — L720 Epidermal cyst: Secondary | ICD-10-CM

## 2022-03-25 DIAGNOSIS — Z79899 Other long term (current) drug therapy: Secondary | ICD-10-CM | POA: Diagnosis not present

## 2022-03-25 DIAGNOSIS — L739 Follicular disorder, unspecified: Secondary | ICD-10-CM

## 2022-03-25 DIAGNOSIS — T148XXA Other injury of unspecified body region, initial encounter: Secondary | ICD-10-CM

## 2022-03-25 DIAGNOSIS — L905 Scar conditions and fibrosis of skin: Secondary | ICD-10-CM

## 2022-03-25 DIAGNOSIS — D485 Neoplasm of uncertain behavior of skin: Secondary | ICD-10-CM

## 2022-03-25 NOTE — Patient Instructions (Signed)

## 2022-03-25 NOTE — Progress Notes (Signed)
   Follow-Up Visit   Subjective  Andrew Short is a 52 y.o. male who presents for the following: Follow-up (Folliculitis/Rosacea of scalp - calmed down now - has medication from [facility] for flares) and Other (Spots of forehead and back). The patient has spots, moles and lesions to be evaluated, some may be new or changing and the patient has concerns that these could be cancer.  The following portions of the chart were reviewed this encounter and updated as appropriate:   Tobacco  Allergies  Meds  Problems  Med Hx  Surg Hx  Fam Hx     Review of Systems:  No other skin or systemic complaints except as noted in HPI or Assessment and Plan.  Objective  Well appearing patient in no apparent distress; mood and affect are within normal limits.  All skin waist up examined.  Left post shoulder Crusted papule 1.1 cm  Forehead Crust   Assessment & Plan  Neoplasm of uncertain behavior of skin Left post shoulder Epidermal / dermal shaving 1.1 cm  Informed consent: discussed and consent obtained   Timeout: patient name, date of birth, surgical site, and procedure verified   Procedure prep:  Patient was prepped and draped in usual sterile fashion Prep type:  Isopropyl alcohol Anesthesia: the lesion was anesthetized in a standard fashion   Anesthetic:  1% lidocaine w/ epinephrine 1-100,000 buffered w/ 8.4% NaHCO3 Instrument used: flexible razor blade   Hemostasis achieved with: pressure, aluminum chloride and electrodesiccation   Outcome: patient tolerated procedure well   Post-procedure details: sterile dressing applied and wound care instructions given   Dressing type: bandage and petrolatum    Specimen 1 - Surgical pathology Differential Diagnosis: Dermatofibroma vs BCC vs other  Check Margins: No  Scar Right Abdomen (side) - Upper Hypertrophic scar secondary to incision Do not recommend any treatment for at least a year. Can plan IL imjections in the future if not  improved.  Folliculitis Scalp Folliculitis/Rosacea Improved. Continue [facility] metronidazole/ivermectin/azelaic acid qhs to scalp prn flares and  Ketoconazole shampoo as directed.  Related Medications ketoconazole (NIZORAL) 2 % shampoo Apply 1 application topically 3 (three) times a week. Wash scalp 3 times weekly, let sit 5 minutes and rinse out  Excoriation Forehead Apply [facility] metronidazole/ivermectin/azelaic acid qhs   Return in about 1 year (around 03/26/2023) for Follow up.  I, Ashok Cordia, CMA, am acting as scribe for Sarina Ser, MD . Documentation: I have reviewed the above documentation for accuracy and completeness, and I agree with the above.  Sarina Ser, MD

## 2022-03-31 ENCOUNTER — Encounter: Payer: Self-pay | Admitting: Dermatology

## 2022-04-01 ENCOUNTER — Telehealth: Payer: Self-pay

## 2022-04-01 NOTE — Telephone Encounter (Signed)
-----   Message from Ralene Bathe, MD sent at 03/28/2022  6:09 PM EST ----- Diagnosis Skin , left post shoulder CONSISTENT WITH SURFACE OF AN EPIDERMOID CYST, CRUSTED  Benign cyst May persist and may need further procedure if persists Recheck next visit

## 2022-04-01 NOTE — Telephone Encounter (Signed)
Left pt msg to call for bx result/sh °

## 2022-05-07 ENCOUNTER — Telehealth: Payer: Self-pay

## 2022-05-07 NOTE — Telephone Encounter (Signed)
Patient informed of pathology results 

## 2022-05-07 NOTE — Telephone Encounter (Signed)
-----   Message from David C Kowalski, MD sent at 03/28/2022  6:09 PM EST ----- Diagnosis Skin , left post shoulder CONSISTENT WITH SURFACE OF AN EPIDERMOID CYST, CRUSTED  Benign cyst May persist and may need further procedure if persists Recheck next visit 

## 2023-03-27 ENCOUNTER — Ambulatory Visit: Payer: BC Managed Care – PPO | Admitting: Dermatology

## 2023-05-06 ENCOUNTER — Other Ambulatory Visit: Payer: Self-pay | Admitting: Internal Medicine

## 2023-05-06 DIAGNOSIS — R1011 Right upper quadrant pain: Secondary | ICD-10-CM

## 2023-05-09 ENCOUNTER — Other Ambulatory Visit: Payer: Self-pay | Admitting: Internal Medicine

## 2023-05-09 ENCOUNTER — Ambulatory Visit
Admission: RE | Admit: 2023-05-09 | Discharge: 2023-05-09 | Disposition: A | Discharge status: Home / Self Care | Source: Ambulatory Visit | Attending: Internal Medicine | Admitting: Internal Medicine

## 2023-05-09 DIAGNOSIS — R1011 Right upper quadrant pain: Secondary | ICD-10-CM

## 2023-05-09 MED ORDER — GADOBUTROL 1 MMOL/ML IV SOLN
9.0000 mL | Freq: Once | INTRAVENOUS | Status: AC | PRN
  Administered 2023-05-09: 9 mL via INTRAVENOUS

## 2023-05-17 ENCOUNTER — Ambulatory Visit

## 2023-10-16 ENCOUNTER — Other Ambulatory Visit: Payer: Self-pay

## 2023-10-16 ENCOUNTER — Emergency Department

## 2023-10-16 DIAGNOSIS — N132 Hydronephrosis with renal and ureteral calculous obstruction: Secondary | ICD-10-CM | POA: Diagnosis not present

## 2023-10-16 DIAGNOSIS — Z7982 Long term (current) use of aspirin: Secondary | ICD-10-CM | POA: Insufficient documentation

## 2023-10-16 DIAGNOSIS — R109 Unspecified abdominal pain: Secondary | ICD-10-CM | POA: Diagnosis present

## 2023-10-16 LAB — BASIC METABOLIC PANEL WITH GFR
Anion gap: 13 (ref 5–15)
BUN: 12 mg/dL (ref 6–20)
CO2: 23 mmol/L (ref 22–32)
Calcium: 9.1 mg/dL (ref 8.9–10.3)
Chloride: 103 mmol/L (ref 98–111)
Creatinine, Ser: 1 mg/dL (ref 0.61–1.24)
GFR, Estimated: >60 mL/min (ref >60)
Glucose, Bld: 111 mg/dL — ABNORMAL HIGH (ref 70–99)
Potassium: 4.5 mmol/L (ref 3.5–5.1)
Sodium: 139 mmol/L (ref 135–145)

## 2023-10-16 LAB — URINALYSIS, ROUTINE W REFLEX MICROSCOPIC
Bilirubin Urine: NEGATIVE
Glucose, UA: NEGATIVE mg/dL
Hgb urine dipstick: NEGATIVE
Ketones, ur: NEGATIVE mg/dL
Leukocytes,Ua: NEGATIVE
Nitrite: NEGATIVE
Protein, ur: NEGATIVE mg/dL
Specific Gravity, Urine: 1.012 (ref 1.005–1.030)
pH: 5 (ref 5.0–8.0)

## 2023-10-16 LAB — CBC WITH DIFFERENTIAL/PLATELET
Abs Immature Granulocytes: 0.01 K/uL (ref 0.00–0.07)
Basophils Absolute: 0.1 K/uL (ref 0.0–0.1)
Basophils Relative: 1 %
Eosinophils Absolute: 0.1 K/uL (ref 0.0–0.5)
Eosinophils Relative: 1 %
HCT: 47.1 % (ref 39.0–52.0)
Hemoglobin: 16.8 g/dL (ref 13.0–17.0)
Immature Granulocytes: 0 %
Lymphocytes Relative: 49 %
Lymphs Abs: 3.1 K/uL (ref 0.7–4.0)
MCH: 30.7 pg (ref 26.0–34.0)
MCHC: 35.7 g/dL (ref 30.0–36.0)
MCV: 85.9 fL (ref 80.0–100.0)
Monocytes Absolute: 0.6 K/uL (ref 0.1–1.0)
Monocytes Relative: 9 %
Neutro Abs: 2.5 K/uL (ref 1.7–7.7)
Neutrophils Relative %: 40 %
Platelets: 206 K/uL (ref 150–400)
RBC: 5.48 MIL/uL (ref 4.22–5.81)
RDW: 11.6 % (ref 11.5–15.5)
WBC: 6.3 K/uL (ref 4.0–10.5)
nRBC: 0 % (ref 0.0–0.2)

## 2023-10-16 MED ORDER — ONDANSETRON HCL 4 MG/2ML IJ SOLN
4.0000 mg | Freq: Once | INTRAMUSCULAR | Status: AC
  Administered 2023-10-16: 4 mg via INTRAVENOUS
  Filled 2023-10-16: qty 2

## 2023-10-16 MED ORDER — SODIUM CHLORIDE 0.9 % IV BOLUS (SEPSIS)
1000.0000 mL | Freq: Once | INTRAVENOUS | Status: AC
  Administered 2023-10-16: 1000 mL via INTRAVENOUS

## 2023-10-16 MED ORDER — MORPHINE SULFATE (PF) 4 MG/ML IV SOLN
4.0000 mg | Freq: Once | INTRAVENOUS | Status: AC
  Administered 2023-10-16: 4 mg via INTRAVENOUS
  Filled 2023-10-16: qty 1

## 2023-10-16 MED ORDER — KETOROLAC TROMETHAMINE 30 MG/ML IJ SOLN
30.0000 mg | Freq: Once | INTRAMUSCULAR | Status: AC
  Administered 2023-10-16: 30 mg via INTRAVENOUS
  Filled 2023-10-16: qty 1

## 2023-10-16 MED ORDER — OXYCODONE-ACETAMINOPHEN 5-325 MG PO TABS
1.0000 | ORAL_TABLET | Freq: Once | ORAL | Status: AC
  Administered 2023-10-16: 1 via ORAL
  Filled 2023-10-16: qty 1

## 2023-10-16 NOTE — ED Triage Notes (Signed)
 Pt reports right side flank pai that began a few hours ago, pt denies hematuria or dysuria. Pt denies hx kidney stones.

## 2023-10-17 ENCOUNTER — Emergency Department
Admission: EM | Admit: 2023-10-17 | Discharge: 2023-10-17 | Disposition: A | Discharge status: Home / Self Care | Attending: Emergency Medicine | Admitting: Emergency Medicine

## 2023-10-17 DIAGNOSIS — N200 Calculus of kidney: Secondary | ICD-10-CM

## 2023-10-17 MED ORDER — IBUPROFEN 800 MG PO TABS: 800.0000 mg | ORAL_TABLET | Freq: Three times a day (TID) | ORAL | 0 refills | Status: AC | PRN

## 2023-10-17 MED ORDER — HYDROMORPHONE HCL 1 MG/ML IJ SOLN
0.5000 mg | Freq: Once | INTRAMUSCULAR | Status: AC
  Administered 2023-10-17: 0.5 mg via INTRAVENOUS
  Filled 2023-10-17: qty 0.5

## 2023-10-17 MED ORDER — ONDANSETRON HCL 4 MG/2ML IJ SOLN
4.0000 mg | Freq: Once | INTRAMUSCULAR | Status: DC
Start: 2023-10-17 — End: 2023-10-17

## 2023-10-17 MED ORDER — HYDROMORPHONE HCL 1 MG/ML IJ SOLN: 0.5000 mg | Freq: Once | INTRAMUSCULAR | Status: DC

## 2023-10-17 MED ORDER — ONDANSETRON 4 MG PO TBDP
4.0000 mg | ORAL_TABLET | Freq: Once | ORAL | Status: AC
  Administered 2023-10-17: 4 mg via ORAL
  Filled 2023-10-17: qty 1

## 2023-10-17 MED ORDER — ONDANSETRON 4 MG PO TBDP: 4.0000 mg | ORAL_TABLET | Freq: Four times a day (QID) | ORAL | 0 refills | Status: AC | PRN

## 2023-10-17 MED ORDER — TAMSULOSIN HCL 0.4 MG PO CAPS
0.4000 mg | ORAL_CAPSULE | Freq: Once | ORAL | Status: AC
  Administered 2023-10-17: 0.4 mg via ORAL
  Filled 2023-10-17: qty 1

## 2023-10-17 MED ORDER — TAMSULOSIN HCL 0.4 MG PO CAPS: 0.4000 mg | ORAL_CAPSULE | Freq: Every day | ORAL | 0 refills | Status: AC

## 2023-10-17 MED ORDER — HYDROMORPHONE HCL 1 MG/ML IJ SOLN
1.0000 mg | Freq: Once | INTRAMUSCULAR | Status: AC
  Administered 2023-10-17: 1 mg via INTRAVENOUS
  Filled 2023-10-17: qty 1

## 2023-10-17 MED ORDER — OXYCODONE HCL 5 MG PO TABS: 5.0000 mg | ORAL_TABLET | Freq: Three times a day (TID) | ORAL | 0 refills | Status: AC | PRN

## 2023-10-17 MED ORDER — ACETAMINOPHEN 500 MG PO TABS
1000.0000 mg | ORAL_TABLET | Freq: Once | ORAL | Status: AC
  Administered 2023-10-17: 1000 mg via ORAL
  Filled 2023-10-17: qty 2

## 2023-10-17 NOTE — Discharge Instructions (Addendum)

## 2023-10-17 NOTE — ED Provider Notes (Signed)
 [facility] Provider Note    Event Date/Time   First MD Initiated Contact with Patient 10/17/23 [phone]     (approximate)   History   Flank Pain   HPI  Andrew Short is a 53 y.o. male with history of depression, hyperlipidemia who presents to the emergency department with complaints of right-sided abdominal pain, nausea without vomiting that started tonight.  Was concerned initially that this could be from gas as he has had a recent gastric bypass.  Denies any diarrhea, dysuria or hematuria.  No prior history of kidney stones.  Has also had prior cholecystectomy.   History provided by patient, significant other.    Past Medical History:  Diagnosis Date   Depression    GERD (gastroesophageal reflux disease)    Hyperlipemia    IBS (irritable bowel syndrome)    Neuropathy    BIL.    Past Surgical History:  Procedure Laterality Date   COLONOSCOPY WITH PROPOFOL  N/A 09/12/2015   Procedure: COLONOSCOPY WITH PROPOFOL ;  Surgeon: Gladis RAYMOND Mariner, MD;  Location: [facility] ENDOSCOPY;  Service: Endoscopy;  Laterality: N/A;   EYE SURGERY     LASIK   GASTRIC BYPASS     KNEE ARTHROSCOPY Left    LIPOSUCTION TRUNK  2007    MEDICATIONS:  Prior to Admission medications   Medication Sig Start Date End Date Taking? Authorizing Provider  allopurinol (ZYLOPRIM) 100 MG tablet Take 100 mg by mouth daily. 09/08/20   [provider]  ascorbic acid (VITAMIN C) 1000 MG tablet Take by mouth.    [provider]  aspirin 81 MG EC tablet Take by mouth. 07/11/17   [provider]  aspirin 81 MG tablet Take 81 mg by mouth daily.    [provider]  Cholecalciferol 25 MCG (1000 UT) tablet Take by mouth.    [provider]  ketoconazole  (NIZORAL ) 2 % shampoo Apply 1 application topically 3 (three) times a week. Wash scalp 3 times weekly, let sit 5 minutes and rinse out 11/08/20   Hester Alm BROCKS, MD  mometasone  (ELOCON ) 0.1 % cream Apply 1  application topically as directed. Qd up to 5 days a week to bug bites on legs prn flares 11/08/20   Hester Alm BROCKS, MD  Multiple Vitamin (MULTIVITAMIN) tablet Take 1 tablet by mouth daily.    [provider]  ondansetron  (ZOFRAN -ODT) 4 MG disintegrating tablet Take 1 tablet (4 mg total) by mouth every 8 (eight) hours as needed for nausea or vomiting. 09/04/21   Dorothyann Drivers, MD  oxyCODONE -acetaminophen  (PERCOCET/ROXICET) 5-325 MG tablet Take 1 tablet by mouth every 6 (six) hours as needed for severe pain. 11/13/21   Geiple, Joshua, PA-C  pravastatin (PRAVACHOL) 40 MG tablet Take by mouth. 05/29/20 05/29/21  [provider]    Physical Exam   Triage Vital Signs: ED Triage Vitals  Encounter Vitals Group     BP 10/16/23 2151 (!) 150/107     Girls Systolic BP Percentile --      Girls Diastolic BP Percentile --      Boys Systolic BP Percentile --      Boys Diastolic BP Percentile --      Pulse Rate 10/16/23 2151 80     Resp 10/16/23 2151 19     Temp 10/16/23 2151 98.4 F (36.9 C)     Temp src --      SpO2 10/16/23 2151 100 %     Weight 10/16/23 2150 190 lb (86.2 kg)  Height 10/16/23 2150 5' 11 (1.803 m)     Head Circumference --      Peak Flow --      Pain Score 10/16/23 2150 9     Pain Loc --      Pain Education --      Exclude from Growth Chart --     Most recent vital signs: Vitals:   10/16/23 2151  BP: (!) 150/107  Pulse: 80  Resp: 19  Temp: 98.4 F (36.9 C)  SpO2: 100%    CONSTITUTIONAL: Alert, responds appropriately to questions.  Appears uncomfortable HEAD: Normocephalic, atraumatic EYES: Conjunctivae clear, pupils appear equal, sclera nonicteric ENT: normal nose; moist mucous membranes NECK: Supple, normal ROM CARD: RRR; S1 and S2 appreciated RESP: Normal chest excursion without splinting or tachypnea; breath sounds clear and equal bilaterally; no wheezes, no rhonchi, no rales, no hypoxia or respiratory distress, speaking full  sentences ABD/GI: Non-distended; soft, tender throughout the right side of his abdomen without guarding or rebound BACK: The back appears normal EXT: Normal ROM in all joints; no deformity noted, no edema SKIN: Normal color for age and race; warm; no rash on exposed skin NEURO: Moves all extremities equally, normal speech PSYCH: The patient's mood and manner are appropriate.   ED Results / Procedures / Treatments   LABS: (all labs ordered are listed, but only abnormal results are displayed) Labs Reviewed  BASIC METABOLIC PANEL WITH GFR - Abnormal; Notable for the following components:      Result Value   Glucose, Bld 111 (*)    All other components within normal limits  URINALYSIS, ROUTINE W REFLEX MICROSCOPIC - Abnormal; Notable for the following components:   Color, Urine YELLOW (*)    APPearance CLEAR (*)    All other components within normal limits  CBC WITH DIFFERENTIAL/PLATELET     EKG:   RADIOLOGY: My personal review and interpretation of imaging: CT scan shows distal 2 mm right ureteral stone.  No appendicitis.  I have personally reviewed all radiology reports.   CT Renal Stone Study Result Date: 10/16/2023 CLINICAL DATA:  Right flank pain EXAM: CT ABDOMEN AND PELVIS WITHOUT CONTRAST TECHNIQUE: Multidetector CT imaging of the abdomen and pelvis was performed following the standard protocol without IV contrast. RADIATION DOSE REDUCTION: This exam was performed according to the departmental dose-optimization program which includes automated exposure control, adjustment of the mA and/or kV according to patient size and/or use of iterative reconstruction technique. COMPARISON:  11/13/2021 FINDINGS: Lower chest: No acute abnormality.  Small hiatal hernia. Hepatobiliary: No focal liver abnormality is seen. Status post cholecystectomy. No biliary dilatation. Pancreas: No focal abnormality or ductal dilatation. Spleen: No focal abnormality.  Normal size. Adrenals/Urinary Tract: Mild  right hydronephrosis due to 2 mm distal right ureteral stone. Punctate nonobstructing stones in the kidneys bilaterally. No hydronephrosis on the left. Adrenal glands and urinary bladder unremarkable. Stomach/Bowel: Left colonic diverticulosis. No active diverticulitis. Prior gastric bypass. Stomach and small bowel unremarkable. No bowel obstruction or inflammatory process. Vascular/Lymphatic: Scattered aortic atherosclerosis. No evidence of aneurysm or adenopathy. Reproductive: Mildly prominent prostate Other: No free fluid or free air. Musculoskeletal: No acute bony abnormality. IMPRESSION: 2 mm distal right ureteral stone with mild right hydronephrosis. Punctate bilateral nephrolithiasis. Prior gastric bypass.  Small hiatal hernia. Left colonic diverticulosis. Electronically Signed   By: Franky Crease M.D.   On: 10/16/2023 22:19     PROCEDURES:  Critical Care performed: No      Procedures    IMPRESSION /  MDM / ASSESSMENT AND PLAN / ED COURSE  I reviewed the triage vital signs and the nursing notes.    Patient here for right side abdominal pain, nausea.    DIFFERENTIAL DIAGNOSIS (includes but not limited to):   Appendicitis, colitis, bowel obstruction, UTI, pyelonephritis, kidney stone   Patient's presentation is most consistent with acute presentation with potential threat to life or bodily function.   PLAN: Workup initiated from triage.  Labs show no leukocytosis.  Normal creatinine.  Urine does not show any sign of infection.  CT of the abdomen pelvis reviewed and interpreted by myself and the radiologist and shows distal 2 mm right ureteral stone with mild right hydronephrosis.  No inflammatory changes noted otherwise.  Pain poorly controlled after Percocet in triage.  He was given morphine , Toradol  and Zofran  and states he is feeling better but pain is starting to come back.  Will continue hydration and give Dilaudid , additional Zofran  as needed.  Anticipate discharge home once  he is feeling better.   MEDICATIONS GIVEN IN ED: Medications  oxyCODONE -acetaminophen  (PERCOCET/ROXICET) 5-325 MG per tablet 1 tablet (1 tablet Oral Given 10/16/23 2154)  sodium chloride  0.9 % bolus 1,000 mL (0 mLs Intravenous Stopped 10/17/23 0159)  ketorolac  (TORADOL ) 30 MG/ML injection 30 mg (30 mg Intravenous Given 10/16/23 2341)  morphine  (PF) 4 MG/ML injection 4 mg (4 mg Intravenous Given 10/16/23 2342)  ondansetron  (ZOFRAN ) injection 4 mg (4 mg Intravenous Given 10/16/23 2341)  HYDROmorphone  (DILAUDID ) injection 1 mg (1 mg Intravenous Given 10/17/23 0201)  HYDROmorphone  (DILAUDID ) injection 0.5 mg (0.5 mg Intravenous Given 10/17/23 0334)  HYDROmorphone  (DILAUDID ) injection 1 mg (1 mg Intravenous Given 10/17/23 0404)  acetaminophen  (TYLENOL ) tablet 1,000 mg (1,000 mg Oral Given 10/17/23 0402)  tamsulosin  (FLOMAX ) capsule 0.4 mg (0.4 mg Oral Given 10/17/23 0403)  ondansetron  (ZOFRAN -ODT) disintegrating tablet 4 mg (4 mg Oral Given 10/17/23 0427)     ED COURSE: Patient required multiple rounds of pain medication and antiemetics here.  Feeling better and requesting discharge home.  Will provide with urine strainer, urology follow-up, prescriptions for pain and nausea medicine, Flomax .  Husband at bedside to drive him home.  At this time, I do not feel there is any life-threatening condition present. I reviewed all nursing notes, vitals, pertinent previous records.  All lab and urine results, EKGs, imaging ordered have been independently reviewed and interpreted by myself.  I reviewed all available radiology reports from any imaging ordered this visit.  Based on my assessment, I feel the patient is safe to be discharged home without further emergent workup and can continue workup as an outpatient as needed. Discussed all findings, treatment plan as well as usual and customary return precautions.  They verbalize understanding and are comfortable with this plan.  Outpatient follow-up has been provided as  needed.  All questions have been answered.    CONSULTS:  none   OUTSIDE RECORDS REVIEWED: Reviewed recent gastroenterology and internal medicine notes.       FINAL CLINICAL IMPRESSION(S) / ED DIAGNOSES   Final diagnoses:  Kidney stone     Rx / DC Orders   ED Discharge Orders          Ordered    oxyCODONE  (ROXICODONE ) 5 MG immediate release tablet  Every 8 hours PRN        10/17/23 0333    ibuprofen  (ADVIL ) 800 MG tablet  Every 8 hours PRN        10/17/23 0333    ondansetron  (ZOFRAN -ODT) 4  MG disintegrating tablet  Every 6 hours PRN        10/17/23 0333    tamsulosin  (FLOMAX ) 0.4 MG CAPS capsule  Daily        10/17/23 0333             Note:  This document was prepared using Dragon voice recognition software and may include unintentional dictation errors.   Trin[place] Petron, Josette SAILOR, DO 10/17/23 [phone]

## 2023-10-17 NOTE — ED Notes (Signed)
 Pt discharged to ED circle at this time in wheelchair and left with all belongings. Pt ABCs intact. RR even and unlabored. Pt somewhat nauseous but provided with Zofran  ODT to help symptoms. Pt denies further needs from this RN.

## 2023-10-17 NOTE — ED Notes (Signed)
 Pt ABCs intact. RR even and unlabored. Pt in NAD. Bed in lowest locked position. Call bell in reach. Denies needs at this time.

## 2023-10-17 NOTE — ED Notes (Addendum)
 Pt reporting to ED d/t flank pain on the R side. Pt with confirmed kidney stones in ureters. Pt denies bleeding with urination, just abdominal pain and nausea. Pt ABCs intact. RR even and unlabored. Pt in NAD. Bed in lowest locked position. Call bell in reach. Denies needs at this time.   Past Medical History:  Diagnosis Date   Depression    GERD (gastroesophageal reflux disease)    Hyperlipemia    IBS (irritable bowel syndrome)    Neuropathy    BIL.

## 2023-10-17 NOTE — ED Notes (Signed)
 PT ask for ice chips provided.
